# Patient Record
Sex: Female | Born: 1940 | Race: White | Hispanic: No | Marital: Married | Smoking: Never smoker
Health system: Southern US, Community
[De-identification: ages and names within clinical notes are randomized; demographics above are authoritative.]

## PROBLEM LIST (undated history)

## (undated) DIAGNOSIS — I10 Essential (primary) hypertension: Secondary | ICD-10-CM

## (undated) DIAGNOSIS — K219 Gastro-esophageal reflux disease without esophagitis: Secondary | ICD-10-CM

## (undated) DIAGNOSIS — Z86010 Personal history of colonic polyps: Principal | ICD-10-CM

## (undated) DIAGNOSIS — I2510 Atherosclerotic heart disease of native coronary artery without angina pectoris: Secondary | ICD-10-CM

## (undated) DIAGNOSIS — E785 Hyperlipidemia, unspecified: Secondary | ICD-10-CM

## (undated) DIAGNOSIS — Z9289 Personal history of other medical treatment: Secondary | ICD-10-CM

## (undated) HISTORY — DX: Hyperlipidemia, unspecified: E78.5

## (undated) HISTORY — DX: Personal history of other medical treatment: Z92.89

## (undated) HISTORY — DX: Essential (primary) hypertension: I10

## (undated) HISTORY — DX: Gastro-esophageal reflux disease without esophagitis: K21.9

## (undated) HISTORY — DX: Personal history of colonic polyps: Z86.010

## (undated) HISTORY — PX: ROTATOR CUFF REPAIR: SHX139

## (undated) HISTORY — DX: Atherosclerotic heart disease of native coronary artery without angina pectoris: I25.10

## (undated) HISTORY — PX: CATARACT EXTRACTION: SUR2

## (undated) HISTORY — PX: COLONOSCOPY: SHX174

## (undated) HISTORY — PX: TONSILLECTOMY: SUR1361

## (undated) HISTORY — PX: TUBAL LIGATION: SHX77

---

## 2007-03-11 DIAGNOSIS — Z86010 Personal history of colonic polyps: Secondary | ICD-10-CM

## 2007-03-11 DIAGNOSIS — Z860101 Personal history of adenomatous and serrated colon polyps: Secondary | ICD-10-CM | POA: Insufficient documentation

## 2007-03-11 HISTORY — DX: Personal history of adenomatous and serrated colon polyps: Z86.0101

## 2007-03-11 HISTORY — DX: Personal history of colonic polyps: Z86.010

## 2009-04-04 ENCOUNTER — Encounter: Admission: RE | Admit: 2009-04-04 | Discharge: 2009-04-04 | Payer: Self-pay | Admitting: Family Medicine

## 2009-05-02 ENCOUNTER — Encounter: Admission: RE | Admit: 2009-05-02 | Discharge: 2009-05-02 | Payer: Self-pay | Admitting: Family Medicine

## 2009-05-14 ENCOUNTER — Ambulatory Visit (HOSPITAL_COMMUNITY): Admission: RE | Admit: 2009-05-14 | Discharge: 2009-05-14 | Payer: Self-pay | Admitting: Family Medicine

## 2009-12-23 LAB — HM DIABETES EYE EXAM

## 2010-04-27 IMAGING — CR DG THORACIC SPINE 3V
3 series · 3 of 3 positions shown · non-contrast
Comparison: None

CLINICAL DATA: Back pain.

THORACIC SPINE - 2 VIEW + SWIMMERS

[t t-spine a.p.]
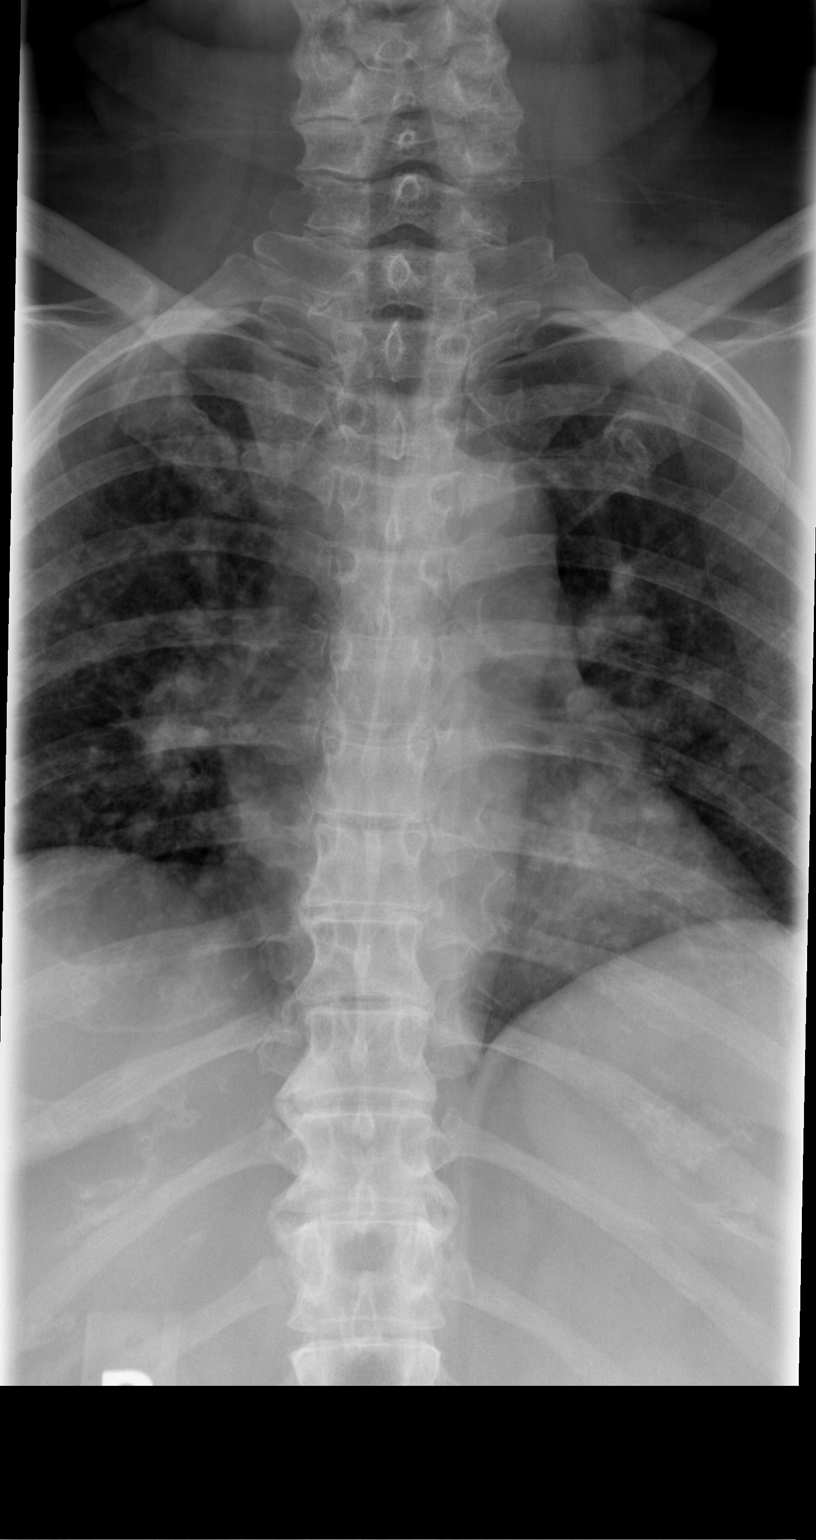

[t t-spine lat *]
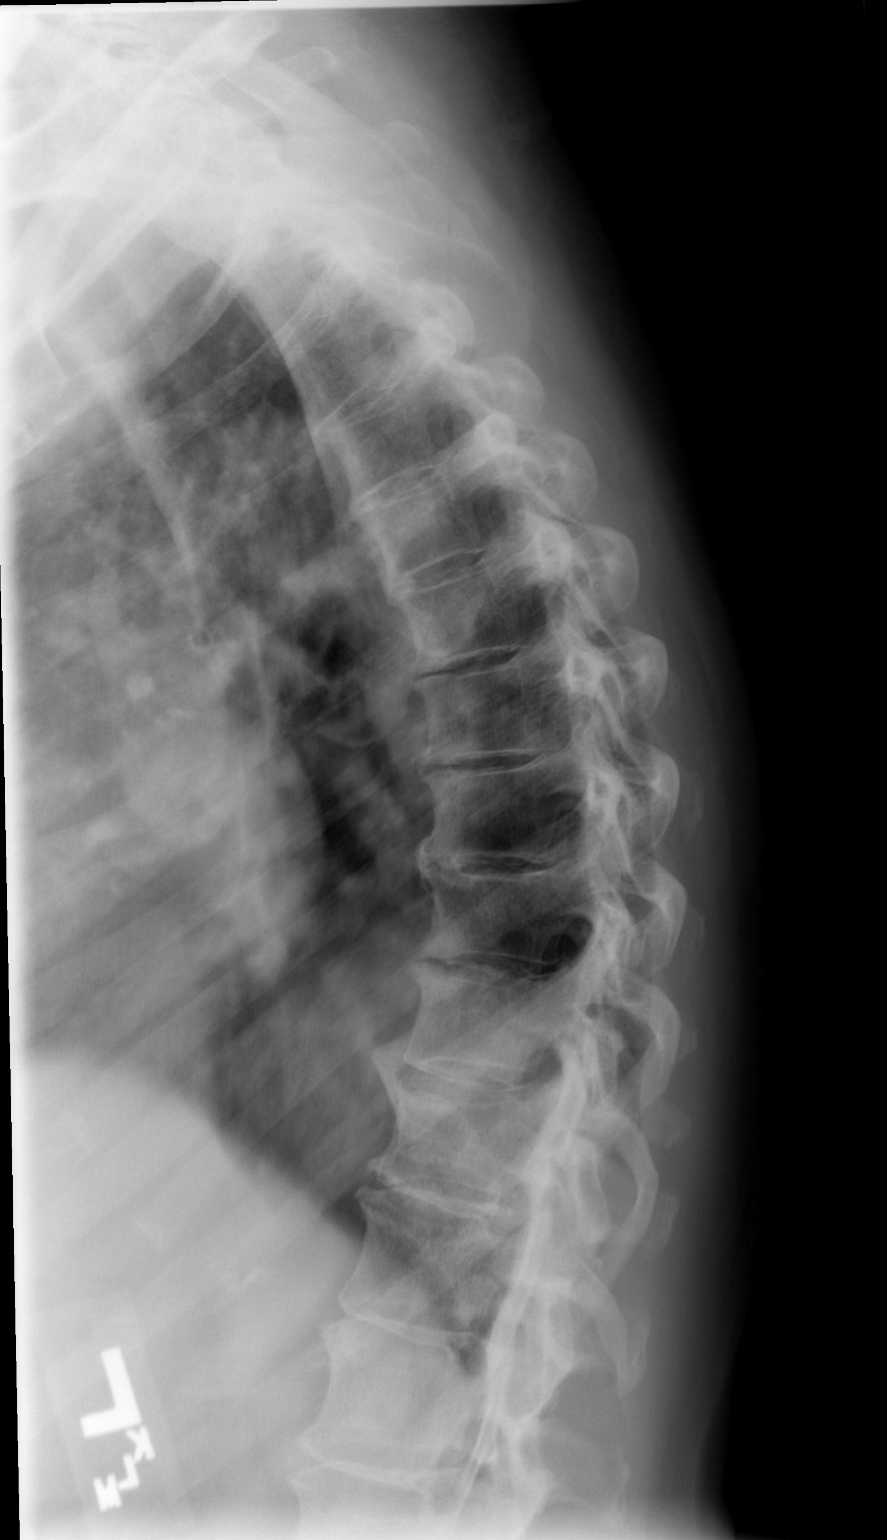

[t swimmers]
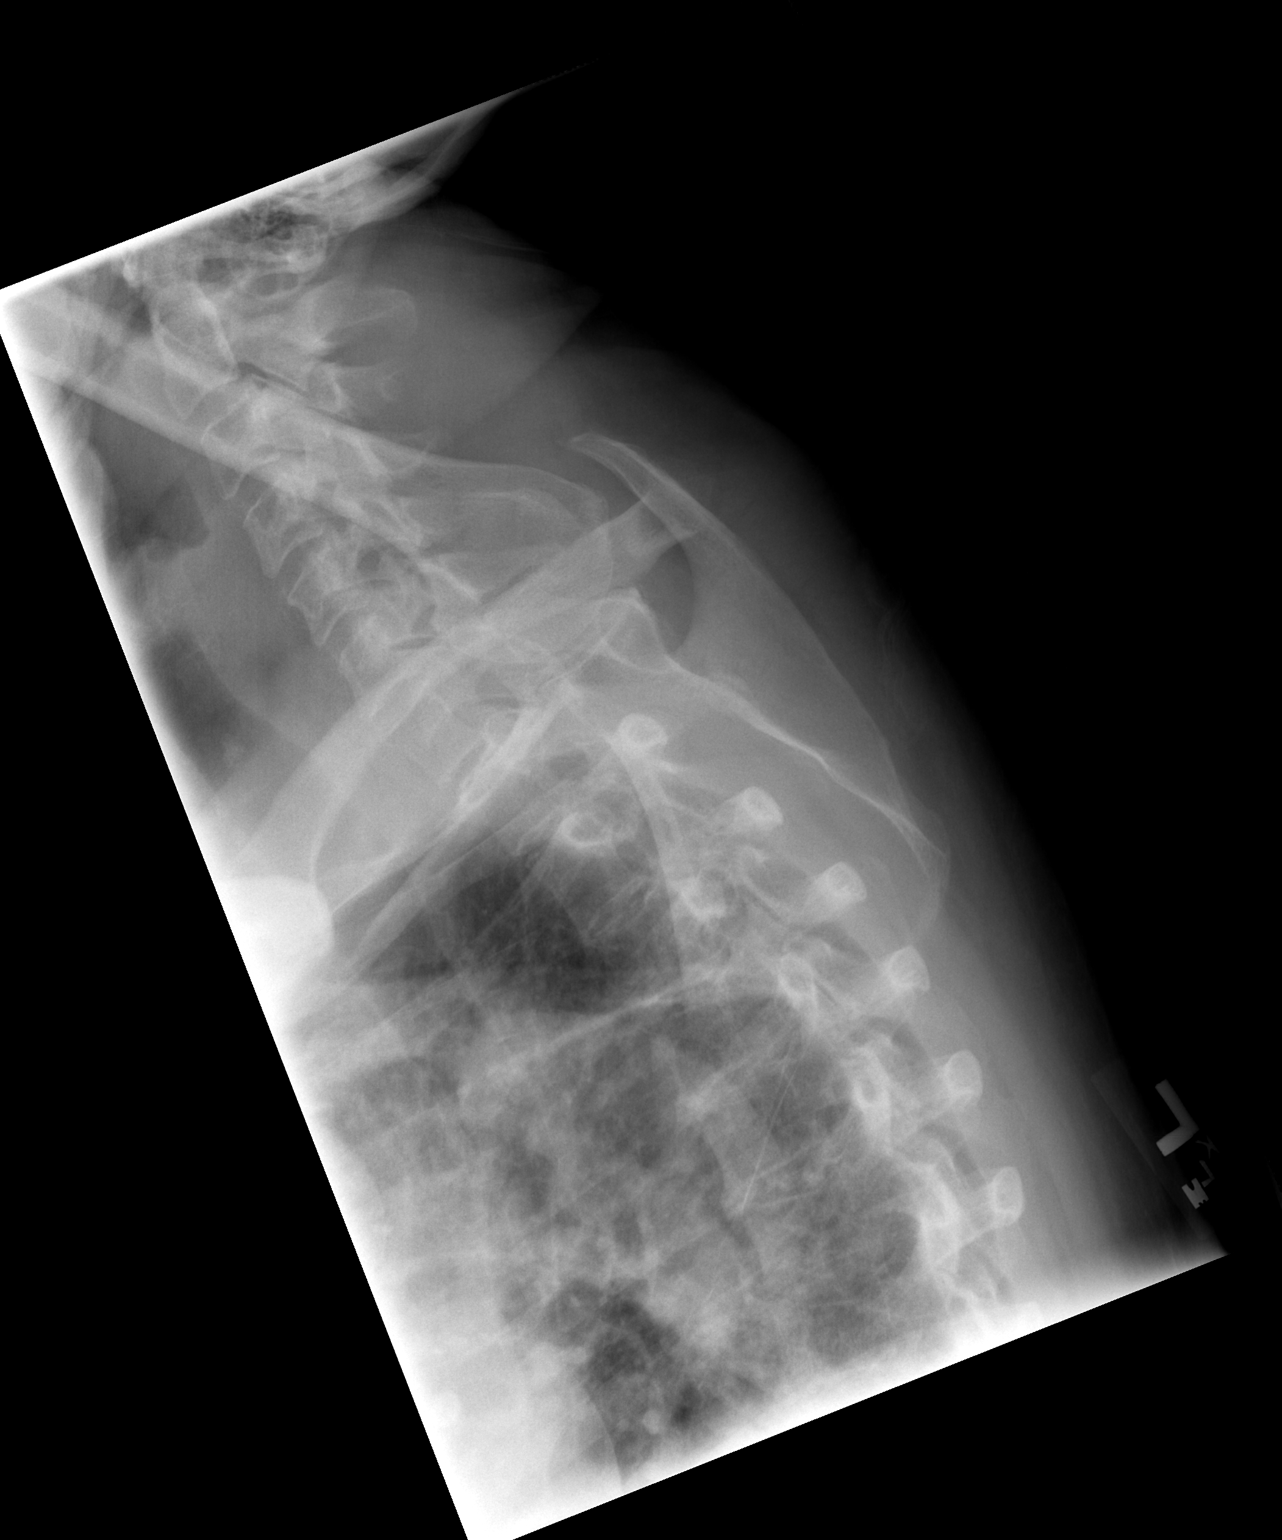

[3 of 3 positions shown; findings below may reference images not displayed]

FINDINGS: Degenerative disc disease noted throughout the mid and
lower thoracic spine.  There is normal alignment.  No fracture.
Visualized posterior ribs unremarkable.
IMPRESSION: Mild to moderate degenerative disc disease.  No acute findings.

## 2010-08-01 ENCOUNTER — Encounter: Admission: RE | Admit: 2010-08-01 | Discharge: 2010-08-01 | Payer: Self-pay | Admitting: Family Medicine

## 2010-12-29 LAB — HM DIABETES EYE EXAM

## 2011-03-23 LAB — GLUCOSE, CAPILLARY: Glucose-Capillary: 86 mg/dL (ref 70–99)

## 2011-09-10 ENCOUNTER — Other Ambulatory Visit: Payer: Self-pay | Admitting: Family Medicine

## 2011-09-22 ENCOUNTER — Ambulatory Visit
Admission: RE | Admit: 2011-09-22 | Discharge: 2011-09-22 | Disposition: A | Discharge status: Home / Self Care | Payer: Medicare Other | Source: Ambulatory Visit | Attending: Family Medicine | Admitting: Family Medicine

## 2012-02-26 LAB — HM DIABETES EYE EXAM

## 2012-04-11 LAB — ABI

## 2012-04-20 ENCOUNTER — Encounter: Payer: Self-pay | Admitting: *Deleted

## 2012-04-20 DIAGNOSIS — K219 Gastro-esophageal reflux disease without esophagitis: Secondary | ICD-10-CM | POA: Insufficient documentation

## 2012-07-05 ENCOUNTER — Other Ambulatory Visit: Payer: Self-pay | Admitting: Family Medicine

## 2012-07-05 DIAGNOSIS — Z780 Asymptomatic menopausal state: Secondary | ICD-10-CM

## 2012-07-05 DIAGNOSIS — Z1231 Encounter for screening mammogram for malignant neoplasm of breast: Secondary | ICD-10-CM

## 2012-08-08 ENCOUNTER — Ambulatory Visit
Admission: RE | Admit: 2012-08-08 | Discharge: 2012-08-08 | Disposition: A | Discharge status: Home / Self Care | Payer: Medicare Other | Source: Ambulatory Visit | Attending: Family Medicine | Admitting: Family Medicine

## 2012-08-08 DIAGNOSIS — Z1231 Encounter for screening mammogram for malignant neoplasm of breast: Secondary | ICD-10-CM

## 2012-08-08 DIAGNOSIS — Z780 Asymptomatic menopausal state: Secondary | ICD-10-CM

## 2012-08-25 ENCOUNTER — Encounter: Payer: Self-pay | Admitting: Cardiovascular Disease

## 2013-09-29 ENCOUNTER — Other Ambulatory Visit: Payer: Self-pay | Admitting: Family Medicine

## 2013-09-29 DIAGNOSIS — Z1231 Encounter for screening mammogram for malignant neoplasm of breast: Secondary | ICD-10-CM

## 2013-10-25 ENCOUNTER — Ambulatory Visit
Admission: RE | Admit: 2013-10-25 | Discharge: 2013-10-25 | Disposition: A | Discharge status: Home / Self Care | Payer: Medicare Other | Source: Ambulatory Visit | Attending: Family Medicine | Admitting: Family Medicine

## 2013-10-25 DIAGNOSIS — Z1231 Encounter for screening mammogram for malignant neoplasm of breast: Secondary | ICD-10-CM

## 2014-02-22 LAB — HM DIABETES EYE EXAM

## 2015-02-25 LAB — HM DIABETES EYE EXAM

## 2015-04-01 ENCOUNTER — Other Ambulatory Visit: Payer: Self-pay | Admitting: Family Medicine

## 2015-04-01 DIAGNOSIS — Z1231 Encounter for screening mammogram for malignant neoplasm of breast: Secondary | ICD-10-CM

## 2015-04-01 DIAGNOSIS — E2839 Other primary ovarian failure: Secondary | ICD-10-CM

## 2015-04-10 ENCOUNTER — Ambulatory Visit
Admission: RE | Admit: 2015-04-10 | Discharge: 2015-04-10 | Disposition: A | Discharge status: Home / Self Care | Payer: Medicare Other | Source: Ambulatory Visit | Attending: Family Medicine | Admitting: Family Medicine

## 2015-04-10 DIAGNOSIS — Z1231 Encounter for screening mammogram for malignant neoplasm of breast: Secondary | ICD-10-CM

## 2015-04-10 DIAGNOSIS — E2839 Other primary ovarian failure: Secondary | ICD-10-CM

## 2015-10-04 ENCOUNTER — Encounter: Payer: Self-pay | Admitting: Internal Medicine

## 2015-10-17 ENCOUNTER — Telehealth: Payer: Self-pay | Admitting: Internal Medicine

## 2015-10-17 ENCOUNTER — Ambulatory Visit (AMBULATORY_SURGERY_CENTER): Payer: Self-pay

## 2015-10-17 DIAGNOSIS — Z860100 Personal history of colon polyps, unspecified: Secondary | ICD-10-CM

## 2015-10-17 NOTE — Telephone Encounter (Signed)
Rec'd from [facility] forward 5 pages to Dr.Gessner

## 2015-10-17 NOTE — Progress Notes (Signed)
No allergies to eggs or soy No diet/weight loss meds No home oxygen No past problems with anesthesia  Has email; refused emmi 

## 2015-10-22 ENCOUNTER — Encounter: Payer: Self-pay | Admitting: Internal Medicine

## 2015-10-31 ENCOUNTER — Encounter: Payer: Self-pay | Admitting: Internal Medicine

## 2015-10-31 ENCOUNTER — Ambulatory Visit (AMBULATORY_SURGERY_CENTER): Payer: Medicare Other | Admitting: Internal Medicine

## 2015-10-31 VITALS — BP 106/58 | HR 65 | Temp 96.2°F | Resp 27 | Ht 60.0 in | Wt 133.4 lb

## 2015-10-31 DIAGNOSIS — Z86010 Personal history of colonic polyps: Secondary | ICD-10-CM | POA: Diagnosis not present

## 2015-10-31 MED ORDER — SODIUM CHLORIDE 0.9 % IV SOLN: 500.0000 mL | INTRAVENOUS | Status: DC

## 2015-10-31 NOTE — Patient Instructions (Addendum)
   No polyps today!  I do not recommend further routine repeat colonoscopy for you.  I appreciate the opportunity to care for you. Iva Booparl E. Delio Slates, MD, FACG  YOU HAD AN ENDOSCOPIC PROCEDURE TODAY AT THE [facility]:   Refer to the procedure report that was given to you for any specific questions about what was found during the examination.  If the procedure report does not answer your questions, please call your gastroenterologist to clarify.  If you requested that your care partner not be given the details of your procedure findings, then the procedure report has been included in a sealed envelope for you to review at your convenience later.  YOU SHOULD EXPECT: Some feelings of bloating in the abdomen. Passage of more gas than usual.  Walking can help get rid of the air that was put into your GI tract during the procedure and reduce the bloating. If you had a lower endoscopy (such as a colonoscopy or flexible sigmoidoscopy) you may notice spotting of blood in your stool or on the toilet paper. If you underwent a bowel prep for your procedure, you may not have a normal bowel movement for a few days.  Please Note:  You might notice some irritation and congestion in your nose or some drainage.  This is from the oxygen used during your procedure.  There is no need for concern and it should clear up in a day or so.  SYMPTOMS TO REPORT IMMEDIATELY:   Following lower endoscopy (colonoscopy or flexible sigmoidoscopy):  Excessive amounts of blood in the stool  Significant tenderness or worsening of abdominal pains  Swelling of the abdomen that is new, acute  Fever of 100F or higher  For urgent or emergent issues, a gastroenterologist can be reached at any hour by calling [phone].   DIET: Your first meal following the procedure should be a small meal and then it is ok to progress to your normal diet. Heavy or fried foods are harder to digest and may make you feel nauseous  or bloated.  Likewise, meals heavy in dairy and vegetables can increase bloating.  Drink plenty of fluids but you should avoid alcoholic beverages for 24 hours.  ACTIVITY:  You should plan to take it easy for the rest of today and you should NOT DRIVE or use heavy machinery until tomorrow (because of the sedation medicines used during the test).    FOLLOW UP: Our staff will call the number listed on your records the next business day following your procedure to check on you and address any questions or concerns that you may have regarding the information given to you following your procedure. If we do not reach you, we will leave a message.  However, if you are feeling well and you are not experiencing any problems, there is no need to return our call.  We will assume that you have returned to your regular daily activities without incident.  SIGNATURES/CONFIDENTIALITY: You and/or your care partner have signed paperwork which will be entered into your electronic medical record.  These signatures attest to the fact that that the information above on your After Visit Summary has been reviewed and is understood.  Full responsibility of the confidentiality of this discharge information lies with you and/or your care-partner.  Continue your normal medications  No further need for follow up unless you have any GI problems

## 2015-10-31 NOTE — Progress Notes (Signed)
Patient awakening,vss,report to rn 

## 2015-10-31 NOTE — Op Note (Addendum)
[facility] [address] [place], [id number]   COLONOSCOPY PROCEDURE REPORT  PATIENT: Jade Barber, Jade Barber  MR#: [id number] BIRTHDATE: [birth year] , 74  yrs. old GENDER: female ENDOSCOPIST: Iva Booparl E Tauni Sanks, MD, [facility] PROCEDURE DATE:  10/31/2015 PROCEDURE:   Colonoscopy, surveillance First Screening Colonoscopy - Avg.  risk and is 50 yrs.  old or older - No.  Prior Negative Screening - Now for repeat screening. N/A  History of Adenoma - Now for follow-up colonoscopy & has been > or = to 3 yrs.  Yes hx of adenoma.  Has been 3 or more years since last colonoscopy.  Polyps removed today? No Recommend repeat exam, <10 yrs? No ASA CLASS:   Class III INDICATIONS:Surveillance due to prior colonic neoplasia and PH Colon Adenoma. MEDICATIONS: Propofol 200 mg IV and Monitored anesthesia care  DESCRIPTION OF PROCEDURE:   After the risks benefits and alternatives of the procedure were thoroughly explained, informed consent was obtained.  The digital rectal exam revealed no abnormalities of the rectum.   The LB [id number]-H190 [id number]  endoscope was introduced through the anus and advanced to the cecum, which was identified by both the appendix and ileocecal valve. No adverse events experienced.   The quality of the prep was adequate (MiraLax was used)  The instrument was then slowly withdrawn as the colon was fully examined. Estimated blood loss is zero unless otherwise noted in this procedure report.      COLON FINDINGS: A normal appearing cecum, ileocecal valve, and appendiceal orifice were identified.  The ascending, transverse, descending, sigmoid colon, and rectum appeared unremarkable. Retroflexed views revealed no abnormalities. The time to cecum = 5.2 Withdrawal time = 13.4   The scope was withdrawn and the procedure completed. COMPLICATIONS: There were no immediate complications.  ENDOSCOPIC IMPRESSION: Normal colonoscopy  RECOMMENDATIONS: Routine repeat colonoscopy  screening not necessary.  See me/GI as needed.  eSigned:  Iva Booparl E Aubreigh Fuerte, MD, [facility] 10/31/2015 9:26 AM Revised: 10/31/2015 9:26 AM  cc: The Patient         Dr. Asencion Partridgeamille Andy

## 2015-11-01 ENCOUNTER — Telehealth: Payer: Self-pay | Admitting: *Deleted

## 2015-11-01 NOTE — Telephone Encounter (Signed)
  Follow up Call-  Call back number 10/31/2015  Post procedure Call Back phone  # [phone]  Permission to leave phone message Yes     Patient questions:  Do you have a fever, pain , or abdominal swelling? No. Pain Score  0 *  Have you tolerated food without any problems? Yes.    Have you been able to return to your normal activities? Yes.    Do you have any questions about your discharge instructions: Diet   No. Medications  No. Follow up visit  No.  Do you have questions or concerns about your Care? No.  Actions: * If pain score is 4 or above: No action needed, pain <4.

## 2015-11-12 ENCOUNTER — Encounter: Payer: Self-pay | Admitting: Internal Medicine

## 2016-02-26 DIAGNOSIS — H52223 Regular astigmatism, bilateral: Secondary | ICD-10-CM | POA: Diagnosis not present

## 2016-02-26 LAB — HM DIABETES EYE EXAM

## 2016-03-13 DIAGNOSIS — J069 Acute upper respiratory infection, unspecified: Secondary | ICD-10-CM | POA: Diagnosis not present

## 2016-03-13 DIAGNOSIS — B9789 Other viral agents as the cause of diseases classified elsewhere: Secondary | ICD-10-CM | POA: Diagnosis not present

## 2016-04-03 DIAGNOSIS — E782 Mixed hyperlipidemia: Secondary | ICD-10-CM | POA: Diagnosis not present

## 2016-04-03 DIAGNOSIS — R05 Cough: Secondary | ICD-10-CM | POA: Diagnosis not present

## 2016-04-03 DIAGNOSIS — H8110 Benign paroxysmal vertigo, unspecified ear: Secondary | ICD-10-CM | POA: Diagnosis not present

## 2016-04-03 DIAGNOSIS — E11319 Type 2 diabetes mellitus with unspecified diabetic retinopathy without macular edema: Secondary | ICD-10-CM | POA: Diagnosis not present

## 2016-04-03 DIAGNOSIS — E1169 Type 2 diabetes mellitus with other specified complication: Secondary | ICD-10-CM | POA: Diagnosis not present

## 2016-04-03 DIAGNOSIS — I10 Essential (primary) hypertension: Secondary | ICD-10-CM | POA: Diagnosis not present

## 2016-05-13 DIAGNOSIS — E119 Type 2 diabetes mellitus without complications: Secondary | ICD-10-CM | POA: Diagnosis not present

## 2016-07-03 ENCOUNTER — Other Ambulatory Visit: Payer: Self-pay | Admitting: Family Medicine

## 2016-07-03 DIAGNOSIS — E1139 Type 2 diabetes mellitus with other diabetic ophthalmic complication: Secondary | ICD-10-CM | POA: Diagnosis not present

## 2016-07-03 DIAGNOSIS — Z1231 Encounter for screening mammogram for malignant neoplasm of breast: Secondary | ICD-10-CM

## 2016-07-03 DIAGNOSIS — I10 Essential (primary) hypertension: Secondary | ICD-10-CM | POA: Diagnosis not present

## 2016-07-03 DIAGNOSIS — I2510 Atherosclerotic heart disease of native coronary artery without angina pectoris: Secondary | ICD-10-CM | POA: Diagnosis not present

## 2016-07-03 DIAGNOSIS — I2584 Coronary atherosclerosis due to calcified coronary lesion: Secondary | ICD-10-CM | POA: Diagnosis not present

## 2016-07-17 ENCOUNTER — Ambulatory Visit
Admission: RE | Admit: 2016-07-17 | Discharge: 2016-07-17 | Disposition: A | Discharge status: Home / Self Care | Payer: Medicare Other | Source: Ambulatory Visit | Attending: Family Medicine | Admitting: Family Medicine

## 2016-07-17 DIAGNOSIS — Z1231 Encounter for screening mammogram for malignant neoplasm of breast: Secondary | ICD-10-CM

## 2016-09-14 DIAGNOSIS — Z23 Encounter for immunization: Secondary | ICD-10-CM | POA: Diagnosis not present

## 2016-09-24 DIAGNOSIS — J40 Bronchitis, not specified as acute or chronic: Secondary | ICD-10-CM | POA: Diagnosis not present

## 2016-09-24 DIAGNOSIS — I2510 Atherosclerotic heart disease of native coronary artery without angina pectoris: Secondary | ICD-10-CM | POA: Diagnosis not present

## 2016-09-24 DIAGNOSIS — I2584 Coronary atherosclerosis due to calcified coronary lesion: Secondary | ICD-10-CM | POA: Diagnosis not present

## 2016-09-24 DIAGNOSIS — I10 Essential (primary) hypertension: Secondary | ICD-10-CM | POA: Diagnosis not present

## 2016-10-01 DIAGNOSIS — Z0000 Encounter for general adult medical examination without abnormal findings: Secondary | ICD-10-CM | POA: Diagnosis not present

## 2016-12-04 DIAGNOSIS — E119 Type 2 diabetes mellitus without complications: Secondary | ICD-10-CM | POA: Diagnosis not present

## 2017-03-03 DIAGNOSIS — H1859 Other hereditary corneal dystrophies: Secondary | ICD-10-CM | POA: Diagnosis not present

## 2017-03-03 DIAGNOSIS — H43813 Vitreous degeneration, bilateral: Secondary | ICD-10-CM | POA: Diagnosis not present

## 2017-03-03 DIAGNOSIS — E113291 Type 2 diabetes mellitus with mild nonproliferative diabetic retinopathy without macular edema, right eye: Secondary | ICD-10-CM | POA: Diagnosis not present

## 2017-03-03 DIAGNOSIS — H26493 Other secondary cataract, bilateral: Secondary | ICD-10-CM | POA: Diagnosis not present

## 2017-03-04 DIAGNOSIS — E119 Type 2 diabetes mellitus without complications: Secondary | ICD-10-CM | POA: Diagnosis not present

## 2017-04-16 DIAGNOSIS — E1169 Type 2 diabetes mellitus with other specified complication: Secondary | ICD-10-CM | POA: Diagnosis not present

## 2017-04-16 DIAGNOSIS — I10 Essential (primary) hypertension: Secondary | ICD-10-CM | POA: Diagnosis not present

## 2017-04-16 DIAGNOSIS — E113299 Type 2 diabetes mellitus with mild nonproliferative diabetic retinopathy without macular edema, unspecified eye: Secondary | ICD-10-CM | POA: Diagnosis not present

## 2017-04-16 DIAGNOSIS — E782 Mixed hyperlipidemia: Secondary | ICD-10-CM | POA: Diagnosis not present

## 2017-04-20 DIAGNOSIS — E875 Hyperkalemia: Secondary | ICD-10-CM | POA: Diagnosis not present

## 2017-04-20 DIAGNOSIS — N189 Chronic kidney disease, unspecified: Secondary | ICD-10-CM | POA: Diagnosis not present

## 2017-11-10 ENCOUNTER — Ambulatory Visit: Payer: Medicare Other | Admitting: Family Medicine

## 2017-11-10 ENCOUNTER — Encounter: Payer: Self-pay | Admitting: Family Medicine

## 2017-11-10 VITALS — BP 120/62 | HR 74 | Temp 98.3°F | Ht 60.0 in | Wt 132.9 lb

## 2017-11-10 DIAGNOSIS — G8929 Other chronic pain: Secondary | ICD-10-CM

## 2017-11-10 DIAGNOSIS — I10 Essential (primary) hypertension: Secondary | ICD-10-CM

## 2017-11-10 DIAGNOSIS — R05 Cough: Secondary | ICD-10-CM

## 2017-11-10 DIAGNOSIS — M25511 Pain in right shoulder: Secondary | ICD-10-CM | POA: Diagnosis not present

## 2017-11-10 DIAGNOSIS — M25512 Pain in left shoulder: Secondary | ICD-10-CM | POA: Diagnosis not present

## 2017-11-10 DIAGNOSIS — E119 Type 2 diabetes mellitus without complications: Secondary | ICD-10-CM

## 2017-11-10 DIAGNOSIS — R0982 Postnasal drip: Secondary | ICD-10-CM

## 2017-11-10 DIAGNOSIS — Z23 Encounter for immunization: Secondary | ICD-10-CM | POA: Diagnosis not present

## 2017-11-10 DIAGNOSIS — R059 Cough, unspecified: Secondary | ICD-10-CM

## 2017-11-10 MED ORDER — AMLODIPINE BESYLATE 2.5 MG PO TABS: 2.5000 mg | ORAL_TABLET | Freq: Every day | ORAL | 3 refills | Status: DC

## 2017-11-10 MED ORDER — LORATADINE 10 MG PO TABS: 10.0000 mg | ORAL_TABLET | Freq: Every day | ORAL | 11 refills | Status: AC

## 2017-11-10 MED ORDER — LOSARTAN POTASSIUM 100 MG PO TABS: 100.0000 mg | ORAL_TABLET | Freq: Every day | ORAL | 3 refills | Status: DC

## 2017-11-10 NOTE — Patient Instructions (Addendum)
It is ok to stop taking your Amlodipine (Norvasc) 2.5 mg daily for blood pressure.  Please check your blood pressure at home to see how you do with this change.  If needed we can restart the Amlodipine if your blood pressure becomes elevated.  I have sent in a prescription for Claritin to see if it helps with your allergies and cough.   Cough, Adult Coughing is a reflex that clears your throat and your airways. Coughing helps to heal and protect your lungs. It is normal to cough occasionally, but a cough that happens with other symptoms or lasts a long time may be a sign of a condition that needs treatment. A cough may last only 2-3 weeks (acute), or it may last longer than 8 weeks (chronic). What are the causes? Coughing is commonly caused by:  Breathing in substances that irritate your lungs.  A viral or bacterial respiratory infection.  Allergies.  Asthma.  Postnasal drip.  Smoking.  Acid backing up from the stomach into the esophagus (gastroesophageal reflux).  Certain medicines.  Chronic lung problems, including COPD (or rarely, lung cancer).  Other medical conditions such as heart failure.  Follow these instructions at home: Pay attention to any changes in your symptoms. Take these actions to help with your discomfort:  Take medicines only as told by your health care provider. ? If you were prescribed an antibiotic medicine, take it as told by your health care provider. Do not stop taking the antibiotic even if you start to feel better. ? Talk with your health care provider before you take a cough suppressant medicine.  Drink enough fluid to keep your urine clear or pale yellow.  If the air is dry, use a cold steam vaporizer or humidifier in your bedroom or your home to help loosen secretions.  Avoid anything that causes you to cough at work or at home.  If your cough is worse at night, try sleeping in a semi-upright position.  Avoid cigarette smoke. If you smoke,  quit smoking. If you need help quitting, ask your health care provider.  Avoid caffeine.  Avoid alcohol.  Rest as needed.  Contact a health care provider if:  You have new symptoms.  You cough up pus.  Your cough does not get better after 2-3 weeks, or your cough gets worse.  You cannot control your cough with suppressant medicines and you are losing sleep.  You develop pain that is getting worse or pain that is not controlled with pain medicines.  You have a fever.  You have unexplained weight loss.  You have night sweats. Get help right away if:  You cough up blood.  You have difficulty breathing.  Your heartbeat is very fast. This information is not intended to replace advice given to you by your health care provider. Make sure you discuss any questions you have with your health care provider. Document Released: 05/29/2011 Document Revised: 05/07/2016 Document Reviewed: 02/06/2015 Elsevier Interactive Patient Education  2017 Elsevier Inc.  Allergies, Adult An allergy is when your body's defense system (immune system) overreacts to an otherwise harmless substance (allergen) that you breathe in or eat or something that touches your skin. When you come into contact with something that you are allergic to, your immune system produces certain proteins (antibodies). These proteins cause cells to release chemicals (histamines) that trigger the symptoms of an allergic reaction. Allergies often affect the nasal passages (allergic rhinitis), eyes (allergic conjunctivitis), skin (atopic dermatitis), and stomach. Allergies can be  mild or severe. Allergies cannot spread from person to person (are not contagious). They can develop at any age and may be outgrown. What increases the risk? You may be at greater risk of allergies if other people in your family have allergies. What are the signs or symptoms? Symptoms depend on what type of allergy you have. They may include:  Runny,  stuffy nose.  Sneezing.  Itchy mouth, ears, or throat.  Postnasal drip.  Sore throat.  Itchy, red, watery, or puffy eyes.  Skin rash or hives.  Stomach pain.  Vomiting.  Diarrhea.  Bloating.  Wheezing or coughing.  People with a severe allergy to food, medicine, or an insect bite may have a life-threatening allergic reaction (anaphylaxis). Symptoms of anaphylaxis include:  Hives.  Itching.  Flushed face.  Swollen lips, tongue, or mouth.  Tight or swollen throat.  Chest pain or tightness in the chest.  Trouble breathing or shortness of breath.  Rapid heartbeat.  Dizziness or fainting.  Vomiting.  Diarrhea.  Pain in the abdomen.  How is this diagnosed? This condition is diagnosed based on:  Your symptoms.  Your family and medical history.  A physical exam.  You may need to see a health care provider who specializes in treating allergies (allergist). You may also have tests, including:  Skin tests to see which allergens are causing your symptoms, such as: ? Skin prick test. In this test, your skin is pricked with a tiny needle and exposed to small amounts of possible allergens to see if your skin reacts. ? Intradermal skin test. In this test, a small amount of allergen is injected under your skin to see if your skin reacts. ? Patch test. In this test, a small amount of allergen is placed on your skin and then your skin is covered with a bandage. Your health care provider will check your skin after a couple of days to see if a rash has developed.  Blood tests.  Challenges tests. In this test, you inhale a small amount of allergen by mouth to see if you have an allergic reaction.  You may also be asked to:  Keep a food diary. A food diary is a record of all the foods and drinks you have in a day and any symptoms you experience.  Practice an elimination diet. An elimination diet involves eliminating specific foods from your diet and then adding them  back in one by one to find out if a certain food causes an allergic reaction.  How is this treated? Treatment for allergies depends on your symptoms. Treatment may include:  Cold compresses to soothe itching and swelling.  Eye drops.  Nasal sprays.  Using a saline spray or container (neti pot) to flush out the nose (nasal irrigation). These methods can help clear away mucus and keep the nasal passages moist.  Using a humidifier.  Oral antihistamines or other medicines to block allergic reaction and inflammation.  Skin creams to treat rashes or itching.  Diet changes to eliminate food allergy triggers.  Repeated exposure to tiny amounts of allergens to build up a tolerance and prevent future allergic reactions (immunotherapy). These include: ? Allergy shots. ? Oral treatment. This involves taking small doses of an allergen under the tongue (sublingual immunotherapy).  Emergency epinephrine injection (auto-injector) in case of an allergic emergency. This is a self-injectable, pre-measured medicine that must be given within the first few minutes of a serious allergic reaction.  Follow these instructions at home:  Avoid known  allergens whenever possible.  If you suffer from airborne allergens, wash out your nose daily. You can do this with a saline spray or a neti pot to flush out your nose (nasal irrigation).  Take over-the-counter and prescription medicines only as told by your health care provider.  Keep all follow-up visits as told by your health care provider. This is important.  If you are at risk of a severe allergic reaction (anaphylaxis), keep your auto-injector with you at all times.  If you have ever had anaphylaxis, wear a medical alert bracelet or necklace that states you have a severe allergy. Contact a health care provider if:  Your symptoms do not improve with treatment. Get help right away if:  You have symptoms of anaphylaxis, such as: ? Swollen mouth,  tongue, or throat. ? Pain or tightness in your chest. ? Trouble breathing or shortness of breath. ? Dizziness or fainting. ? Severe abdominal pain, vomiting, or diarrhea. This information is not intended to replace advice given to you by your health care provider. Make sure you discuss any questions you have with your health care provider. Document Released: 02/23/2003 Document Revised: 07/30/2016 Document Reviewed: 06/17/2016 Elsevier Interactive Patient Education  [employer]2018 Elsevier Inc.

## 2017-11-10 NOTE — Progress Notes (Signed)
Patient presents to clinic today to establish care.  SUBJECTIVE: PMH: Pt is a 76 yo female with pmh sig for GERD, HTN, and DM II.  [facility] [address].  GERD: -taking omeprazole 20 mg -Does not eat spicy foods -Does not note any association with specific foods  HTN: -Patient does not recall reason for being on blood pressure medicine -Currently taking Norvasc 2.5 mg and losartan 100 mg daily -Has BP cuff at home but not currently checking  DM type II: -Pt taking metformin 1000 mg BID -Pt denies GI issues -Pt checks FSBS every now and again.  Typically 140s rarely 80 in a.m. -Last hemoglobin A1c 6 months ago  Cough: -Has been ongoing for years -Pt denies allergy history -Was on lisinopril in the past, but it was d/c'd  Chronic pain bilateral shoulders: -Patient mentions this at end of visit while walking out. -Wonders if has arthritis. -Never had any imaging done -Pain is off and on -Has Voltaren gel as needed  Allergies: Charlesetta GaribaldiFormaldehyde-rash Ampicillin/amoxicillin-rash  Past surgical history: Cataract surgery C-section x2 Tonsillectomy Bilateral tubal ligation  Social history: Patient is married.  Her husband is currently battling pancreatic cancer and is on hospice.  Patient is his caregiver.  She has an aide that comes in once a week to help with bathing her husband.  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  patient has 2 children.  Patient denies tobacco use, alcohol use, drug use.  Family medical history: Mom-deceased, cancer Dad-deceased, asthma, COPD Sister-Kathy, alive,?  Arthritis Daughter Eunice BlaseDebbie, alive Daughter-Amy, alive MGM-deceased, MI MGF-deceased, DM PGM-deceased PGF-deceased  Health Maintenance: Dental --Dr. Irene LimboGoodrich Vision --Dr. Burgess Estelleanner.   Last vision check February 2018 Immunizations --influenza vaccine given today, Pneumovax in 2009, last TB skin test 2000, last  shingles vaccine 2009.  In the past pt had a positive TB skin test 2/2 vaccination. Colonoscopy --2017 Mammogram --2017 PAP --? Bone Density --years ago   Past Medical History:  Diagnosis Date  . Coronary artery calcification seen on CAT scan   . Diabetes mellitus   . GERD (gastroesophageal reflux disease)   . History of blood transfusion    s/p transfusion  . Hx of adenomatous polyp of colon 03/11/2007  . Hyperlipidemia   . Hypertension     Past Surgical History:  Procedure Laterality Date  . CATARACT EXTRACTION    . CESAREAN SECTION    . COLONOSCOPY    . ROTATOR CUFF REPAIR    . TONSILLECTOMY    . TUBAL LIGATION      Current Outpatient Medications on File Prior to Visit  Medication Sig Dispense Refill  . amLODipine (NORVASC) 10 MG tablet Take 10 mg by mouth daily. Takes 1/2 tablet for 5 mg total    . aspirin EC 81 MG tablet Take 81 mg by mouth daily.    Marland Kitchen. conjugated estrogens (PREMARIN) vaginal cream APPLY A PEA SIZED AMOUNT VAGINALLY ONCE OR TWICE A WEEK    . diclofenac sodium (VOLTAREN) 1 % GEL Place 2 g onto the skin.    Marland Kitchen. glucose blood (ONE TOUCH ULTRA TEST) test strip USE ONE STRIP TO CHECK GLUCOSE THREE TIMES DAILY    . Insulin Syringe-Needle U-100 (B-D INS SYR ULTRAFINE 1CC/31G) 31G X 5/16" 1 ML MISC USE AS DIRECTED    . losartan (COZAAR) 100 MG tablet TAKE ONE TABLET BY MOUTH DAILY    . metFORMIN (GLUCOPHAGE) 1000 MG tablet Take  1,000 mg by mouth 2 (two) times daily with a meal.    . Multiple Vitamin (MULTIVITAMIN) tablet Take 1 tablet by mouth.    . omega-3 fish oil (MAXEPA) 1000 MG CAPS capsule Take 2 g by mouth.    Marland Kitchen. omeprazole (PRILOSEC OTC) 20 MG tablet Take 20 mg by mouth.    Letta Pate. ONETOUCH DELICA LANCETS 33G MISC USE ONE LANCET TO TEST BLOOD GLUCOSE THREE TIMES A DAY    . simvastatin (ZOCOR) 40 MG tablet Take 40 mg by mouth daily.     No current facility-administered medications on file prior to visit.     Allergies  Allergen Reactions  . Crestor  [Rosuvastatin Calcium]     Myalgias   . Enalapril   . Lipitor [Atorvastatin]     myaglgias  . Amoxicillin Rash  . Formaldehyde Rash    Family History  Problem Relation Age of Onset  . Colon cancer Mother   . Colon cancer Father     Social History   Socioeconomic History  . Marital status: Married    Spouse name: Not on file  . Number of children: Not on file  . Years of education: Not on file  . Highest education level: Not on file  Social Needs  . Financial resource strain: Not on file  . Food insecurity - worry: Not on file  . Food insecurity - inability: Not on file  . Transportation needs - medical: Not on file  . Transportation needs - non-medical: Not on file  Occupational History  . Not on file  Tobacco Use  . Smoking status: Never Smoker  . Smokeless tobacco: Never Used  Substance and Sexual Activity  . Alcohol use: No  . Drug use: No  . Sexual activity: Not on file  Other Topics Concern  . Not on file  Social History Narrative  . Not on file    ROS General: Denies fever, chills, night sweats, changes in weight, changes in appetite HEENT: Denies headaches, ear pain, changes in vision, sore throat    +sneezing, occasional rhinorrhea CV: Denies CP, palpitations, SOB, orthopnea Pulm: Denies SOB, wheezing    +cough GI: Denies abdominal pain, nausea, vomiting, diarrhea, constipation GU: Denies dysuria, hematuria, frequency, vaginal discharge Msk: Denies muscle cramps, joint pains  +shoulder pain b/l Neuro: Denies weakness, numbness, tingling Skin: Denies rashes, bruising Psych: Denies depression, anxiety, hallucinations  BP 120/62 (BP Location: Left Arm, Patient Position: Sitting, Cuff Size: Normal)   Pulse 74   Temp 98.3 F (36.8 C) (Oral)   Ht 5' (1.524 m)   Wt 132 lb 14.4 oz (60.3 kg)   BMI 25.96 kg/m   Physical Exam Gen. Pleasant, well developed, well-nourished, in NAD HEENT - [place]/AT, PERRL, no scleral icterus, no nasal drainage, pharynx with  post nasal drainage and mild erythema, no exudate. Neck: No JVD, no thyromegaly Lungs: no use of accessory muscles, CTAB, no wheezes, rales or rhonchi Cardiovascular: RRR, No r/g/m, no peripheral edema Abdomen: BS present, soft, nontender,nondistended Musculoskeletal: No deformities, moves all four extremities, no cyanosis or clubbing, normal tone Neuro:  A&Ox3, CN II-XII intact, normal gait Skin:  Warm, dry, intact, no lesions Psych: normal affect, mood appropriate   No results found for this or any previous visit (from the past 2160 hour(s)).  Assessment/Plan: Cough -Discussed possibly 2/2 postnasal drainage and allergies -Will start Claritin at night to see if cough and sneezing improves  Post-nasal drainage -Will start Claritin  Controlled type 2 diabetes mellitus without complication,  without long-term current use of insulin (HCC) -Continue metformin 1000 mg twice daily -Patient encouraged to check fingerstick blood sugar daily. -Lifestyle modifications encouraged -We will check hemoglobin A1c next OFV  Essential hypertension  -Patient wishes to stop taking Norvasc.  Okay with this plan is only taking 2.5 mg. -Patient to stop taking Norvasc 2.5 mg daily.  Patient to check BP at home.  If BP is elevated will need to restart Norvasc 2.5 mg. -Continue losartan 100 mg daily -Given refills on both meds - Plan: losartan (COZAAR) 100 MG tablet, amLODipine (NORVASC) 2.5 MG tablet  Need for immunization against influenza  - Plan: Flu vaccine HIGH DOSE PF  Chronic pain of both shoulders -We will need to properly evaluate.  Patient to schedule follow-up appointment for this. -In the meantime will obtain x-rays -Patient has Voltaren gel as needed - Plan: DG Shoulder Left, DG Shoulder Right   Follow-up in the next few months for CPE and labs.  Patient can follow-up sooner for bilateral shoulder and hip pain.

## 2018-02-10 ENCOUNTER — Encounter: Payer: Self-pay | Admitting: Family Medicine

## 2018-02-10 ENCOUNTER — Ambulatory Visit: Payer: Medicare Other | Admitting: Family Medicine

## 2018-02-10 VITALS — BP 128/60 | HR 55 | Temp 97.9°F | Wt 129.0 lb

## 2018-02-10 DIAGNOSIS — I10 Essential (primary) hypertension: Secondary | ICD-10-CM

## 2018-02-10 DIAGNOSIS — Z0000 Encounter for general adult medical examination without abnormal findings: Secondary | ICD-10-CM | POA: Diagnosis not present

## 2018-02-10 DIAGNOSIS — E119 Type 2 diabetes mellitus without complications: Secondary | ICD-10-CM

## 2018-02-10 DIAGNOSIS — Z13220 Encounter for screening for lipoid disorders: Secondary | ICD-10-CM | POA: Diagnosis not present

## 2018-02-10 LAB — LIPID PANEL
CHOL/HDL RATIO: 6
Cholesterol: 245 mg/dL — ABNORMAL HIGH (ref 0–200)
HDL: 39.3 mg/dL (ref >39.00)
LDL Cholesterol: 173 mg/dL — ABNORMAL HIGH (ref 0–99)
NONHDL: 205.48
Triglycerides: 162 mg/dL — ABNORMAL HIGH (ref 0.0–149.0)
VLDL: 32.4 mg/dL (ref 0.0–40.0)

## 2018-02-10 LAB — BASIC METABOLIC PANEL
BUN: 18 mg/dL (ref 6–23)
CALCIUM: 10 mg/dL (ref 8.4–10.5)
CO2: 31 meq/L (ref 19–32)
Chloride: 105 mEq/L (ref 96–112)
Creatinine, Ser: 0.87 mg/dL (ref 0.40–1.20)
GFR: 67.21 mL/min (ref >60.00)
Glucose, Bld: 92 mg/dL (ref 70–99)
POTASSIUM: 4.1 meq/L (ref 3.5–5.1)
SODIUM: 142 meq/L (ref 135–145)

## 2018-02-10 LAB — CBC
HEMATOCRIT: 40.5 % (ref 36.0–46.0)
Hemoglobin: 13.9 g/dL (ref 12.0–15.0)
MCHC: 34.3 g/dL (ref 30.0–36.0)
MCV: 89.7 fl (ref 78.0–100.0)
Platelets: 220 10*3/uL (ref 150.0–400.0)
RBC: 4.52 Mil/uL (ref 3.87–5.11)
RDW: 12.9 % (ref 11.5–15.5)
WBC: 7.3 10*3/uL (ref 4.0–10.5)

## 2018-02-10 LAB — HEMOGLOBIN A1C: Hgb A1c MFr Bld: 6.7 % — ABNORMAL HIGH (ref 4.6–6.5)

## 2018-02-10 NOTE — Progress Notes (Signed)
Subjective:    Patient ID: Jade FischerMary B Barber, female    DOB: 10/13/1941, 77 y.o.   MRN: [id number]  Chief Complaint  Patient presents with  . Follow-up    HPI Patient was seen today for physical.  Pt states she has been doing ok, but is tired.  Pt is taking care of her husband, also a pt of this provider, who is on hospice 2/2 pancreatic cancer.  Pt states she no longer likes leaving her husband alone.  He is to the point now where he cannot get out of bed to use the bedside commode.  Pt states her daughter from [place] is in [place] visiting.  Pt states there have been no changes in her health or medications.  She states she has been so busy with her husband that at times she forgets to eat or check her blood sugar.  She does state the last time she checked it it was "in normal range".  Pt states she is fasting today and wouldn't mind having labs.  Last mammogram was in 2018.  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  Past Medical History:  Diagnosis Date  . Coronary artery calcification seen on CAT scan   . Diabetes mellitus   . GERD (gastroesophageal reflux disease)   . History of blood transfusion    s/p transfusion  . Hx of adenomatous polyp of colon 03/11/2007  . Hyperlipidemia   . Hypertension     Allergies  Allergen Reactions  . Crestor [Rosuvastatin Calcium]     Myalgias   . Enalapril   . Lipitor [Atorvastatin]     myaglgias  . Amoxicillin Rash  . Formaldehyde Rash    ROS General: Denies fever, chills, night sweats, changes in weight, changes in appetite HEENT: Denies headaches, ear pain, changes in vision, rhinorrhea, sore throat CV: Denies CP, palpitations, SOB, orthopnea Pulm: Denies SOB, cough, wheezing GI: Denies abdominal pain, nausea, vomiting, diarrhea, constipation GU: Denies dysuria, hematuria, frequency, vaginal discharge Msk: Denies muscle cramps, joint pains Neuro: Denies weakness, numbness, tingling Skin: Denies rashes,  bruising Psych: Denies depression, anxiety, hallucinations     Objective:    Blood pressure 128/60, pulse (!) 55, temperature 97.9 F (36.6 C), temperature source Oral, weight 129 lb (58.5 kg), SpO2 96 %.   Gen. Pleasant, well-nourished, in no distress, normal affect   HEENT: [place]/AT, face symmetric, wearing glasses, no scleral icterus, PERRLA, nares patent without drainage, pharynx without erythema or exudate.  Bilateral hearing aids in place.  TMs normal. No cervical lymphadenopathy. Neck: No JVD, no thyromegaly, no carotid bruits Lungs: no accessory muscle use, CTAB, no wheezes or rales Cardiovascular: RRR, no m/r/g, no peripheral edema Abdomen: BS present, soft, NT/ND Musculoskeletal: No deformities, no cyanosis or clubbing, normal tone Neuro:  A&Ox3, CN II-XII intact, normal gait Skin:  Warm, no lesions/ rash   Wt Readings from Last 3 Encounters:  02/10/18 129 lb (58.5 kg)  11/10/17 132 lb 14.4 oz (60.3 kg)  10/31/15 133 lb 6.4 oz (60.5 kg)    No results found for: WBC, HGB, HCT, PLT, GLUCOSE, CHOL, TRIG, HDL, LDLDIRECT, LDLCALC, ALT, AST, NA, K, CL, CREATININE, BUN, CO2, TSH, PSA, INR, GLUF, HGBA1C, MICROALBUR  Assessment/Plan:  Routine health maintenance  -Anticipatory guidance given including wearing seatbelts, smoke detectors in the home, increasing physical activity -Given handout - Plan: CBC (no diff), Basic metabolic panel  Screening for cholesterol level  - Plan: Lipid panel  Controlled type 2 diabetes mellitus without complication,  without long-term current use of insulin (HCC)  -Continue metformin 1000 mg twice daily -Check FSBS daily when able -Needs diabetic retinopathy screen, however at this time pt is unable to do this given her husband's health. - Plan: Hemoglobin A1c  Essential hypertension  -Controlled -Continue Norvasc 2.5 mg, losartan 100 mg daily - Plan: Basic metabolic panel  Follow-up in the next few months, sooner if needed  Abbe Amsterdam, MD

## 2018-02-10 NOTE — Patient Instructions (Addendum)
Preventive Care 65 Years and Older, Female Preventive care refers to lifestyle choices and visits with your health care provider that can promote health and wellness. What does preventive care include?  A yearly physical exam. This is also called an annual well check.  Dental exams once or twice a year.  Routine eye exams. Ask your health care provider how often you should have your eyes checked.  Personal lifestyle choices, including: ? Daily care of your teeth and gums. ? Regular physical activity. ? Eating a healthy diet. ? Avoiding tobacco and drug use. ? Limiting alcohol use. ? Practicing safe sex. ? Taking low-dose aspirin every day. ? Taking vitamin and mineral supplements as recommended by your health care provider. What happens during an annual well check? The services and screenings done by your health care provider during your annual well check will depend on your age, overall health, lifestyle risk factors, and family history of disease. Counseling Your health care provider may ask you questions about your:  Alcohol use.  Tobacco use.  Drug use.  Emotional well-being.  Home and relationship well-being.  Sexual activity.  Eating habits.  History of falls.  Memory and ability to understand (cognition).  Work and work environment.  Reproductive health.  Screening You may have the following tests or measurements:  Height, weight, and BMI.  Blood pressure.  Lipid and cholesterol levels. These may be checked every 5 years, or more frequently if you are over 50 years old.  Skin check.  Lung cancer screening. You may have this screening every year starting at age 55 if you have a 30-pack-year history of smoking and currently smoke or have quit within the past 15 years.  Fecal occult blood test (FOBT) of the stool. You may have this test every year starting at age 50.  Flexible sigmoidoscopy or colonoscopy. You may have a sigmoidoscopy every 5 years or  a colonoscopy every 10 years starting at age 50.  Hepatitis C blood test.  Hepatitis B blood test.  Sexually transmitted disease (STD) testing.  Diabetes screening. This is done by checking your blood sugar (glucose) after you have not eaten for a while (fasting). You may have this done every 1-3 years.  Bone density scan. This is done to screen for osteoporosis. You may have this done starting at age 77.  Mammogram. This may be done every 1-2 years. Talk to your health care provider about how often you should have regular mammograms.  Talk with your health care provider about your test results, treatment options, and if necessary, the need for more tests. Vaccines Your health care provider may recommend certain vaccines, such as:  Influenza vaccine. This is recommended every year.  Tetanus, diphtheria, and acellular pertussis (Tdap, Td) vaccine. You may need a Td booster every 10 years.  Varicella vaccine. You may need this if you have not been vaccinated.  Zoster vaccine. You may need this after age 60.  Measles, mumps, and rubella (MMR) vaccine. You may need at least one dose of MMR if you were born in 1957 or later. You may also need a second dose.  Pneumococcal 13-valent conjugate (PCV13) vaccine. One dose is recommended after age 77.  Pneumococcal polysaccharide (PPSV23) vaccine. One dose is recommended after age 77.  Meningococcal vaccine. You may need this if you have certain conditions.  Hepatitis A vaccine. You may need this if you have certain conditions or if you travel or work in places where you may be exposed to hepatitis   A.  Hepatitis B vaccine. You may need this if you have certain conditions or if you travel or work in places where you may be exposed to hepatitis B.  Haemophilus influenzae type b (Hib) vaccine. You may need this if you have certain conditions.  Talk to your health care provider about which screenings and vaccines you need and how often you  need them. This information is not intended to replace advice given to you by your health care provider. Make sure you discuss any questions you have with your health care provider. Document Released: 12/27/2015 Document Revised: 08/19/2016 Document Reviewed: 10/01/2015 Elsevier Interactive Patient Education  2018 Elsevier Inc.  

## 2018-02-14 ENCOUNTER — Encounter: Payer: Self-pay | Admitting: *Deleted

## 2018-03-02 ENCOUNTER — Ambulatory Visit: Payer: Medicare Other | Admitting: Family Medicine

## 2018-03-02 ENCOUNTER — Encounter: Payer: Self-pay | Admitting: Family Medicine

## 2018-03-02 VITALS — BP 108/60 | HR 96 | Temp 98.5°F | Wt 128.6 lb

## 2018-03-02 DIAGNOSIS — K0889 Other specified disorders of teeth and supporting structures: Secondary | ICD-10-CM

## 2018-03-02 DIAGNOSIS — M545 Low back pain, unspecified: Secondary | ICD-10-CM

## 2018-03-02 DIAGNOSIS — G8929 Other chronic pain: Secondary | ICD-10-CM

## 2018-03-02 MED ORDER — DICLOFENAC SODIUM 75 MG PO TBEC: 75.0000 mg | DELAYED_RELEASE_TABLET | Freq: Two times a day (BID) | ORAL | 0 refills | Status: AC

## 2018-03-02 NOTE — Progress Notes (Signed)
   Subjective:    Patient ID: Jade FischerMary B Olexa, female    DOB: 12/01/1941, 77 y.o.   MRN: [id number]  HPI Here for 2 problems. First she has had intermittent low back pain for about one year, but lately this has gotten worse. She has stiffness and pain in the center of the lower back. No radiation to the legs. No hx of trauma. This is usually worst when she gets up out of bed or when she stands up after sitting a long time. Then as she walks around it improves. She takes Aleve or Advil for this sometimes. Heat helps a little. Also for 2 days she has had pain and swelling around the left jaw. It dies not hurt to chew. No fever. She has upper and lower partial dentures.    Review of Systems  Constitutional: Negative.   HENT: Positive for dental problem. Negative for ear pain, postnasal drip and sore throat.   Respiratory: Negative.   Cardiovascular: Negative.   Musculoskeletal: Positive for back pain.  Neurological: Negative for weakness and numbness.       Objective:   Physical Exam  Constitutional: She is oriented to person, place, and time. She appears well-developed and well-nourished.  HENT:  The left lower jaw area is swollen and tender. The TMJ is not tender. She has 3 original teeth on the left lower jaw, and she is tender around the base of the back molar. There are several tender enlarged lymph nodes under the left mandible angle   Cardiovascular: Normal rate, regular rhythm, normal heart sounds and intact distal pulses.  Pulmonary/Chest: Effort normal and breath sounds normal. No respiratory distress. She has no wheezes. She has no rales.  Musculoskeletal:  Mildly tender over the lower spine with full ROM. Negative SLR   Neurological: She is alert and oriented to person, place, and time.          Assessment & Plan:  Lower back pain, likely due to disc disease. Try Diclofenac bid as needed. She also has inflammation around the left lower molar so she will contact her dentist  tomorrow.  Gershon CraneStephen Jaton Eilers, MD

## 2018-03-04 ENCOUNTER — Telehealth: Payer: Self-pay | Admitting: Family Medicine

## 2018-03-04 ENCOUNTER — Other Ambulatory Visit: Payer: Self-pay | Admitting: Family Medicine

## 2018-03-04 MED ORDER — METFORMIN HCL 1000 MG PO TABS: 1000.0000 mg | ORAL_TABLET | Freq: Two times a day (BID) | ORAL | 1 refills | Status: DC

## 2018-03-04 NOTE — Telephone Encounter (Signed)
Medication was refilled; pt was called and made aware.

## 2018-03-04 NOTE — Telephone Encounter (Signed)
Copied from CRM [phone]. Topic: Quick Communication - Rx Refill/Question >> Mar 04, 2018 12:39 PM Alexander BergeronBarksdale, Harvey B wrote: Medication: metFORMIN (GLUCOPHAGE) 1000 MG tablet [[id number]][[id number]]  Has the patient contacted their pharmacy? Yes.   (Agent: If no, request that the patient contact the pharmacy for the refill.) Preferred [facility] (with phone number [facility]): [facility] Agent: Please be advised that RX refills may take up to 3 business days. We ask that you follow-up with your pharmacy.

## 2018-03-04 NOTE — Telephone Encounter (Signed)
On refill request for glucophage, do not have a provider on order.

## 2018-03-11 NOTE — Telephone Encounter (Signed)
Patient called and notified the [facility] [address], she says "it should've been sent to [facility] [address]. Will you remove all pharmacies from my chart except [facility]?" I advised I will update chart and call [facility] to retrieve the prescription. [facility] called and spoke to [place] who says she will retrieve the [facility] [address]

## 2018-03-11 NOTE — Telephone Encounter (Signed)
Left message for patient to call back- [facility]- verify were she wants Rx sent- she has several pharmacies listed.

## 2018-03-11 NOTE — Telephone Encounter (Signed)
[facility] pharmacy called and said they never received the refill request for the metformin. Please re-send

## 2018-03-21 DIAGNOSIS — M21962 Unspecified acquired deformity of left lower leg: Secondary | ICD-10-CM | POA: Diagnosis not present

## 2018-03-21 DIAGNOSIS — M21961 Unspecified acquired deformity of right lower leg: Secondary | ICD-10-CM | POA: Diagnosis not present

## 2018-03-21 DIAGNOSIS — R238 Other skin changes: Secondary | ICD-10-CM | POA: Diagnosis not present

## 2018-03-21 DIAGNOSIS — B353 Tinea pedis: Secondary | ICD-10-CM | POA: Diagnosis not present

## 2018-03-21 DIAGNOSIS — E1351 Other specified diabetes mellitus with diabetic peripheral angiopathy without gangrene: Secondary | ICD-10-CM | POA: Diagnosis not present

## 2018-04-12 DIAGNOSIS — B351 Tinea unguium: Secondary | ICD-10-CM | POA: Diagnosis not present

## 2018-04-12 DIAGNOSIS — E1351 Other specified diabetes mellitus with diabetic peripheral angiopathy without gangrene: Secondary | ICD-10-CM | POA: Diagnosis not present

## 2018-04-12 DIAGNOSIS — M79672 Pain in left foot: Secondary | ICD-10-CM | POA: Diagnosis not present

## 2018-04-12 DIAGNOSIS — M79671 Pain in right foot: Secondary | ICD-10-CM | POA: Diagnosis not present

## 2018-05-05 ENCOUNTER — Encounter: Payer: Self-pay | Admitting: Family Medicine

## 2018-05-05 ENCOUNTER — Ambulatory Visit: Payer: Medicare Other | Admitting: Family Medicine

## 2018-05-05 VITALS — BP 128/64 | HR 68 | Temp 97.6°F | Wt 128.0 lb

## 2018-05-05 DIAGNOSIS — J019 Acute sinusitis, unspecified: Secondary | ICD-10-CM | POA: Diagnosis not present

## 2018-05-05 DIAGNOSIS — J40 Bronchitis, not specified as acute or chronic: Secondary | ICD-10-CM

## 2018-05-05 MED ORDER — AZITHROMYCIN 250 MG PO TABS: ORAL_TABLET | ORAL | 0 refills | Status: DC

## 2018-05-05 MED ORDER — BENZONATATE 100 MG PO CAPS: 100.0000 mg | ORAL_CAPSULE | Freq: Two times a day (BID) | ORAL | 0 refills | Status: DC | PRN

## 2018-05-05 NOTE — Patient Instructions (Signed)

## 2018-05-05 NOTE — Progress Notes (Signed)
Subjective:    Patient ID: Jade Barber, female    DOB: 1941/03/23, 77 y.o.   MRN: [id number]  No chief complaint on file.   HPI Patient was seen today for acute concern.  Pt endorses nasal congestion, sore throat, dry cough rhinorrhea x1 week or more.  Pt notes ear pressure and mild ear pain/pressure this morning.  Pt has tried mucinex with no relief.  Pt denies fever, chills, N/V.  Pt states she has been doing ok since her husband Rosanne Ashing died.  Pt states she surprisingly has been able to sleep.  Pt expresses appreciation that this provider called her during that time.   Past Medical History:  Diagnosis Date  . Coronary artery calcification seen on CAT scan   . Diabetes mellitus   . GERD (gastroesophageal reflux disease)   . History of blood transfusion    s/p transfusion  . Hx of adenomatous polyp of colon 03/11/2007  . Hyperlipidemia   . Hypertension     Allergies  Allergen Reactions  . Crestor [Rosuvastatin Calcium]     Myalgias   . Enalapril   . Lipitor [Atorvastatin]     myaglgias  . Amoxicillin Rash  . Formaldehyde Rash    ROS General: Denies fever, chills, night sweats, changes in weight, changes in appetite HEENT: Denies headaches, changes in vision,     + rhinorrhea, sore throat, nasal congestion, ear pain/pressure CV: Denies CP, palpitations, SOB, orthopnea Pulm: Denies SOB, wheezing  +cough GI: Denies abdominal pain, nausea, vomiting, diarrhea, constipation GU: Denies dysuria, hematuria, frequency, vaginal discharge Msk: Denies muscle cramps, joint pains Neuro: Denies weakness, numbness, tingling Skin: Denies rashes, bruising Psych: Denies depression, anxiety, hallucinations     Objective:    Blood pressure 128/64, pulse 68, temperature 97.6 F (36.4 C), temperature source Oral, weight 128 lb (58.1 kg), SpO2 98 %.   Gen. Pleasant, well-nourished, in no distress, normal affect   HEENT: [place]/AT, face symmetric, no scleral icterus, PERRLA, nares patent with  clear drainage, pharynx with mild erythema, no exudate.  TMs full bilaterally.  Mild cervical lymphadenopathy. Lungs: Dry deep cough, no accessory muscle use, CTAB, no wheezes or rales Cardiovascular: RRR, no m/r/g, no peripheral edema Neuro:  A&Ox3, CN II-XII intact, normal gait   Wt Readings from Last 3 Encounters:  03/02/18 128 lb 9.6 oz (58.3 kg)  02/10/18 129 lb (58.5 kg)  11/10/17 132 lb 14.4 oz (60.3 kg)    Lab Results  Component Value Date   WBC 7.3 02/10/2018   HGB 13.9 02/10/2018   HCT 40.5 02/10/2018   PLT 220.0 02/10/2018   GLUCOSE 92 02/10/2018   CHOL 245 (H) 02/10/2018   TRIG 162.0 (H) 02/10/2018   HDL 39.30 02/10/2018   LDLCALC 173 (H) 02/10/2018   NA 142 02/10/2018   K 4.1 02/10/2018   CL 105 02/10/2018   CREATININE 0.87 02/10/2018   BUN 18 02/10/2018   CO2 31 02/10/2018   HGBA1C 6.7 (H) 02/10/2018    Assessment/Plan:  Acute non-recurrent sinusitis, unspecified location -Discussed supportive care -Given handout - Plan: azithromycin (ZITHROMAX) 250 MG tablet  Bronchitis -Discussed various treatment options.  Pt declines short course of prednisone at this time and wishes to wait on inhaler. -Patient if her symptoms continue or she has shortness of breath to notify clinic.  If needed we will send in Rx for inhaler. - Plan: azithromycin (ZITHROMAX) 250 MG tablet, benzonatate (TESSALON) 100 MG capsule  Follow-up PRN  Abbe Amsterdam, MD

## 2018-05-10 ENCOUNTER — Encounter: Payer: Self-pay | Admitting: Family Medicine

## 2018-05-10 ENCOUNTER — Ambulatory Visit: Payer: Medicare Other | Admitting: Family Medicine

## 2018-05-10 VITALS — BP 120/70 | HR 70 | Temp 98.3°F | Wt 128.3 lb

## 2018-05-10 DIAGNOSIS — J40 Bronchitis, not specified as acute or chronic: Secondary | ICD-10-CM

## 2018-05-10 DIAGNOSIS — R0982 Postnasal drip: Secondary | ICD-10-CM

## 2018-05-10 MED ORDER — FLUTICASONE PROPIONATE 50 MCG/ACT NA SUSP: 1.0000 | Freq: Every day | NASAL | 1 refills | Status: AC

## 2018-05-10 MED ORDER — BENZONATATE 100 MG PO CAPS
100.0000 mg | ORAL_CAPSULE | Freq: Two times a day (BID) | ORAL | 0 refills | Status: DC | PRN
Start: 2018-05-10 — End: 2019-01-17

## 2018-05-10 NOTE — Progress Notes (Signed)
Subjective:    Patient ID: Jade Barber, female    DOB: February 01, 1941, 77 y.o.   MRN: [id number]  Chief Complaint  Patient presents with  . Cough  . Sinusitis    HPI Patient was seen today for ongoing concern.  Patient was seen on 05/05/2018 dx'd with bronchitis/sinusitis given azithromycin and Tessalon Perles.  Patient returns today for continued cough, though states she is feeling better.  Pt states the tessalon does help, but she is almost out.  Pt is suppose to fly to [place] on Thursday for her grandson's graduation. Pt denies fever, chills, n/v.   Pt has been using a netti pot with good results.  Pt also notes occasional hoarse voice and rhinorrhea.  Past Medical History:  Diagnosis Date  . Coronary artery calcification seen on CAT scan   . Diabetes mellitus   . GERD (gastroesophageal reflux disease)   . History of blood transfusion    s/p transfusion  . Hx of adenomatous polyp of colon 03/11/2007  . Hyperlipidemia   . Hypertension     Allergies  Allergen Reactions  . Crestor [Rosuvastatin Calcium]     Myalgias   . Enalapril   . Lipitor [Atorvastatin]     myaglgias  . Amoxicillin Rash  . Formaldehyde Rash    ROS General: Denies fever, chills, night sweats, changes in weight, changes in appetite HEENT: Denies headaches, ear pain, changes in vision, rhinorrhea, sore throat  +nasal drainage, rhinorrha-improving, hoarseness. CV: Denies CP, palpitations, SOB, orthopnea Pulm: Denies SOB, wheezing  +cough GI: Denies abdominal pain, nausea, vomiting, diarrhea, constipation GU: Denies dysuria, hematuria, frequency, vaginal discharge Msk: Denies muscle cramps, joint pains Neuro: Denies weakness, numbness, tingling Skin: Denies rashes, bruising Psych: Denies depression, anxiety, hallucinations     Objective:    Blood pressure 120/70, pulse 70, temperature 98.3 F (36.8 C), temperature source Oral, weight 128 lb 4.8 oz (58.2 kg), SpO2 97 %.   Gen. Pleasant, well-nourished,  in no distress, normal affect   HEENT: [place]/AT, face symmetric, conjunctiva clear, no scleral icterus, PERRLA, nares patent without drainage, pharynx with post nasal drainage, no erythema or exudate.  Hearing aids in place. Neck: No JVD, no thyromegaly Lungs: no accessory muscle useC, TAB, no wheezes or rales.  Productive sounding cough. Cardiovascular: RRR, no m/r/g, no peripheral edema Neuro:  A&Ox3, CN II-XII intact, normal gait   Wt Readings from Last 3 Encounters:  05/10/18 128 lb 4.8 oz (58.2 kg)  05/05/18 128 lb (58.1 kg)  03/02/18 128 lb 9.6 oz (58.3 kg)    Lab Results  Component Value Date   WBC 7.3 02/10/2018   HGB 13.9 02/10/2018   HCT 40.5 02/10/2018   PLT 220.0 02/10/2018   GLUCOSE 92 02/10/2018   CHOL 245 (H) 02/10/2018   TRIG 162.0 (H) 02/10/2018   HDL 39.30 02/10/2018   LDLCALC 173 (H) 02/10/2018   NA 142 02/10/2018   K 4.1 02/10/2018   CL 105 02/10/2018   CREATININE 0.87 02/10/2018   BUN 18 02/10/2018   CO2 31 02/10/2018   HGBA1C 6.7 (H) 02/10/2018    Assessment/Plan:  Postnasal drip  -discussed ok to continue using Netti pot or OTC saline nasal spray -discussed proper nasal spray use. - Plan: fluticasone (FLONASE) 50 MCG/ACT nasal spray  Bronchitis  -discussed cough can linger for several wks. -pt reassured based on exam. -pt given RTC or ED precautions including worsening symptoms, fever, chills, weakness, etc -pt encouraged to stay hydrated and eat. - Plan: benzonatate (TESSALON)  100 MG capsule F/u prn  Abbe Amsterdam, MD

## 2018-05-10 NOTE — Patient Instructions (Addendum)
I have sent in a prescription for flonase and tessalon to your pharmacy.  If you would rather use the saline nasal spray, that is fine.  It can be found over the counter at the drug store.  Postnasal Drip Postnasal drip is the feeling of mucus going down the back of your throat. Mucus is a slimy substance that moistens and cleans your nose and throat, as well as the air pockets in face bones near your forehead and cheeks (sinuses). Small amounts of mucus pass from your nose and sinuses down the back of your throat all the time. This is normal. When you produce too much mucus or the mucus gets too thick, you can feel it. Some common causes of postnasal drip include:  Having more mucus because of: ? A cold or the flu. ? Allergies. ? Cold air. ? Certain medicines.  Having more mucus that is thicker because of: ? A sinus or nasal infection. ? Dry air. ? A food allergy.  Follow these instructions at home: Relieving discomfort  Gargle with a salt-water mixture 3-4 times a day or as needed. To make a salt-water mixture, completely dissolve -1 tsp of salt in 1 cup of warm water.  If the air in your home is dry, use a humidifier to add moisture to the air.  Use a saline spray or container (neti pot) to flush out the nose (nasal irrigation). These methods can help clear away mucus and keep the nasal passages moist. General instructions  Take over-the-counter and prescription medicines only as told by your health care provider.  Follow instructions from your health care provider about eating or drinking restrictions. You may need to avoid caffeine.  Avoid things that you know you are allergic to (allergens), like dust, mold, pollen, pets, or certain foods.  Drink enough fluid to keep your urine pale yellow.  Keep all follow-up visits as told by your health care provider. This is important. Contact a health care provider if:  You have a fever.  You have a sore throat.  You have  difficulty swallowing.  You have headache.  You have sinus pain.  You have a cough that does not go away.  The mucus from your nose becomes thick and is green or yellow in color.  You have cold or flu symptoms that last more than 10 days. Summary  Postnasal drip is the feeling of mucus going down the back of your throat.  If your health care provider approves, use nasal irrigation or a nasal spray 2?4 times a day.  Avoid things that you know you are allergic to (allergens), like dust, mold, pollen, pets, or certain foods. This information is not intended to replace advice given to you by your health care provider. Make sure you discuss any questions you have with your health care provider. Document Released: 03/15/2017 Document Revised: 03/15/2017 Document Reviewed: 03/15/2017 Elsevier Interactive Patient Education  [employer].

## 2018-07-12 DIAGNOSIS — B351 Tinea unguium: Secondary | ICD-10-CM | POA: Diagnosis not present

## 2018-07-12 DIAGNOSIS — E1151 Type 2 diabetes mellitus with diabetic peripheral angiopathy without gangrene: Secondary | ICD-10-CM | POA: Diagnosis not present

## 2018-07-12 DIAGNOSIS — M898X7 Other specified disorders of bone, ankle and foot: Secondary | ICD-10-CM | POA: Diagnosis not present

## 2018-07-12 DIAGNOSIS — M79671 Pain in right foot: Secondary | ICD-10-CM | POA: Diagnosis not present

## 2018-08-18 ENCOUNTER — Encounter: Payer: Self-pay | Admitting: Family Medicine

## 2018-08-18 ENCOUNTER — Ambulatory Visit: Payer: Medicare Other | Admitting: Family Medicine

## 2018-08-18 VITALS — BP 128/60 | HR 80 | Temp 97.4°F | Wt 124.0 lb

## 2018-08-18 DIAGNOSIS — I10 Essential (primary) hypertension: Secondary | ICD-10-CM | POA: Diagnosis not present

## 2018-08-18 DIAGNOSIS — E782 Mixed hyperlipidemia: Secondary | ICD-10-CM | POA: Diagnosis not present

## 2018-08-18 DIAGNOSIS — R5383 Other fatigue: Secondary | ICD-10-CM | POA: Diagnosis not present

## 2018-08-18 DIAGNOSIS — E119 Type 2 diabetes mellitus without complications: Secondary | ICD-10-CM | POA: Diagnosis not present

## 2018-08-18 LAB — LIPID PANEL
CHOLESTEROL: 146 mg/dL (ref 0–200)
HDL: 37 mg/dL — ABNORMAL LOW (ref >39.00)
LDL CALC: 77 mg/dL (ref 0–99)
NonHDL: 108.95
Total CHOL/HDL Ratio: 4
Triglycerides: 161 mg/dL — ABNORMAL HIGH (ref 0.0–149.0)
VLDL: 32.2 mg/dL (ref 0.0–40.0)

## 2018-08-18 LAB — TSH: TSH: 1.11 u[IU]/mL (ref 0.35–4.50)

## 2018-08-18 LAB — CBC
HEMATOCRIT: 38.9 % (ref 36.0–46.0)
HEMOGLOBIN: 13.1 g/dL (ref 12.0–15.0)
MCHC: 33.6 g/dL (ref 30.0–36.0)
MCV: 90.2 fl (ref 78.0–100.0)
Platelets: 224 10*3/uL (ref 150.0–400.0)
RBC: 4.31 Mil/uL (ref 3.87–5.11)
RDW: 13.5 % (ref 11.5–15.5)
WBC: 5.7 10*3/uL (ref 4.0–10.5)

## 2018-08-18 LAB — HEMOGLOBIN A1C: HEMOGLOBIN A1C: 6.5 % (ref 4.6–6.5)

## 2018-08-18 LAB — VITAMIN D 25 HYDROXY (VIT D DEFICIENCY, FRACTURES): VITD: 43.32 ng/mL (ref 30.00–100.00)

## 2018-08-18 LAB — T4, FREE: Free T4: 0.81 ng/dL (ref 0.60–1.60)

## 2018-08-18 NOTE — Progress Notes (Signed)
Subjective:    Patient ID: Jade Barber, female    DOB: [birth year], 77 y.o.   MRN: [id number]  No chief complaint on file.   HPI Patient was seen today for f/u.  Pt states she has been doing ok since her husband's death.  She states her children have come to visit and she has good support through her church.  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  HTN: -Taking losartan 100 mg daily and Norvasc 2.5 mg daily  HLD: -Taking Zocor 40 mg daily -Patient endorses occasional soreness but attributes it more to age than her medication -In the past took Crestor, had myalgias  DM: -Patient taking metformin 1000 mg twice daily -Occasionally checks fasting blood sugar, 100-1 20s.  Fatigue: -pt endorses taking 1-2 hour naps during the day -goes to bed at 11 pm, may wake up at 1 or 2 am.  Will stay up for an hour then go back to bed until 8:30 or so. -pt has been told she snores in the past. -unsure if related to her husband's death.  Past Medical History:  Diagnosis Date  . Coronary artery calcification seen on CAT scan   . Diabetes mellitus   . GERD (gastroesophageal reflux disease)   . History of blood transfusion    s/p transfusion  . Hx of adenomatous polyp of colon 03/11/2007  . Hyperlipidemia   . Hypertension     Allergies  Allergen Reactions  . Crestor [Rosuvastatin Calcium]     Myalgias   . Enalapril   . Lipitor [Atorvastatin]     myaglgias  . Amoxicillin Rash  . Formaldehyde Rash    ROS General: Denies fever, chills, night sweats, changes in weight, changes in appetite  +fatigue HEENT: Denies headaches, ear pain, changes in vision, rhinorrhea, sore throat CV: Denies CP, palpitations, SOB, orthopnea Pulm: Denies SOB, cough, wheezing GI: Denies abdominal pain, nausea, vomiting, diarrhea, constipation GU: Denies dysuria, hematuria, frequency, vaginal discharge Msk: Denies muscle cramps, joint pains Neuro: Denies weakness, numbness, tingling Skin: Denies  rashes, bruising Psych: Denies depression, anxiety, hallucinations     Objective:    Blood pressure 128/60, pulse 80, temperature (!) 97.4 F (36.3 C), temperature source Oral, weight 124 lb (56.2 kg), SpO2 97 %.   Gen. Pleasant, well-nourished, in no distress, normal affect   HEENT: [place]/AT, face symmetric, no scleral icterus, PERRLA, nares patent without drainage Lungs: no accessory muscle use, CTAB, no wheezes or rales Cardiovascular: RRR, no m/r/g, no peripheral edema Abdomen: BS present, soft, NT/ND. Neuro:  A&Ox3, CN II-XII intact, normal gait Skin:  Warm, no lesions/ rash Diabetic Foot Exam - Simple   Simple Foot Form Diabetic Foot exam was performed with the following findings:  Yes 08/18/2018  9:12 AM  Visual Inspection No deformities, no ulcerations, no other skin breakdown bilaterally:  Yes Sensation Testing Pulse Check Posterior Tibialis and Dorsalis pulse intact bilaterally:  Yes Comments L foot with slightly decreased fine sensation compared to R.  R foot with mild callous on plantar surface. Mild onychomycosis of R great toe.      Wt Readings from Last 3 Encounters:  05/10/18 128 lb 4.8 oz (58.2 kg)  05/05/18 128 lb (58.1 kg)  03/02/18 128 lb 9.6 oz (58.3 kg)    Lab Results  Component Value Date   WBC 7.3 02/10/2018   HGB 13.9 02/10/2018   HCT 40.5 02/10/2018   PLT 220.0 02/10/2018   GLUCOSE 92 02/10/2018   CHOL 245 (H) 02/10/2018  TRIG 162.0 (H) 02/10/2018   HDL 39.30 02/10/2018   LDLCALC 173 (H) 02/10/2018   NA 142 02/10/2018   K 4.1 02/10/2018   CL 105 02/10/2018   CREATININE 0.87 02/10/2018   BUN 18 02/10/2018   CO2 31 02/10/2018   HGBA1C 6.7 (H) 02/10/2018    Assessment/Plan:  Essential hypertension -controlled -Continue Norvasc 2.5 mg and losartan 100 mg daily -Continue lifestyle modifications  Mixed hyperlipidemia  -Continue Zocor 40 mg daily - Plan: Lipid panel  Controlled type 2 diabetes mellitus without complication, without  long-term current use of insulin (HCC)  -Controlled -Last hemoglobin A1c 6.7% on 02/10/2018 -Continue metformin 1000 mg twice daily -Continue checking FSBS daily - Plan: Hemoglobin A1c  Fatigue, unspecified type  -Possibly 2/2 grieving process, vitamin deficiency, anemia, thyroid -We will obtain labs - Plan: CBC (no diff), Vitamin D, 25-hydroxy, TSH, T4, free  Follow-up PRN in the next few months  Abbe Amsterdam, MD

## 2018-08-18 NOTE — Patient Instructions (Signed)
Diabetes Mellitus and Standards of Medical Care Managing diabetes (diabetes mellitus) can be complicated. Your diabetes treatment may be managed by a team of health care providers, including:  A diet and nutrition specialist (registered dietitian).  A nurse.  A certified diabetes educator (CDE).  A diabetes specialist (endocrinologist).  An eye doctor.  A primary care provider.  A dentist.  Your health care providers follow a schedule in order to help you get the best quality of care. The following schedule is a general guideline for your diabetes management plan. Your health care providers may also give you more specific instructions. HbA1c ( hemoglobin A1c) test This test provides information about blood sugar (glucose) control over the previous 2-3 months. It is used to check whether your diabetes management plan needs to be adjusted.  If you are meeting your treatment goals, this test is done at least 2 times a year.  If you are not meeting treatment goals or if your treatment goals have changed, this test is done 4 times a year.  Blood pressure test  This test is done at every routine medical visit. For most people, the goal is less than 130/80. Ask your health care provider what your goal blood pressure should be. Dental and eye exams  Visit your dentist two times a year.  If you have type 1 diabetes, get an eye exam 3-5 years after you are diagnosed, and then once a year after your first exam. ? If you were diagnosed with type 1 diabetes as a child, get an eye exam when you are age 16 or older and have had diabetes for 3-5 years. After the first exam, you should get an eye exam once a year.  If you have type 2 diabetes, have an eye exam as soon as you are diagnosed, and then once a year after your first exam. Foot care exam  Visual foot exams are done at every routine medical visit. The exams check for cuts, bruises, redness, blisters, sores, or other problems with the  feet.  A complete foot exam is done by your health care provider once a year. This exam includes an inspection of the structure and skin of your feet, and a check of the pulses and sensation in your feet. ? Type 1 diabetes: Get your first exam 3-5 years after diagnosis. ? Type 2 diabetes: Get your first exam as soon as you are diagnosed.  Check your feet every day for cuts, bruises, redness, blisters, or sores. If you have any of these or other problems that are not healing, contact your health care provider. Kidney function test ( urine microalbumin)  This test is done once a year. ? Type 1 diabetes: Get your first test 5 years after diagnosis. ? Type 2 diabetes: Get your first test as soon as you are diagnosed.  If you have chronic kidney disease (CKD), get a serum creatinine and estimated glomerular filtration rate (eGFR) test once a year. Lipid profile (cholesterol, HDL, LDL, triglycerides)  This test should be done when you are diagnosed with diabetes, and every 5 years after the first test. If you are on medicines to lower your cholesterol, you may need to get this test done every year. ? The goal for LDL is less than 100 mg/dL (5.5 mmol/L). If you are at high risk, the goal is less than 70 mg/dL (3.9 mmol/L). ? The goal for HDL is 40 mg/dL (2.2 mmol/L) for men and 50 mg/dL(2.8 mmol/L) for women. An HDL  cholesterol of 60 mg/dL (3.3 mmol/L) or higher gives some protection against heart disease. ? The goal for triglycerides is less than 150 mg/dL (8.3 mmol/L). Immunizations  The yearly flu (influenza) vaccine is recommended for everyone 6 months or older who has diabetes.  The pneumonia (pneumococcal) vaccine is recommended for everyone 2 years or older who has diabetes. If you are 33 or older, you may get the pneumonia vaccine as a series of two separate shots.  The hepatitis B vaccine is recommended for adults shortly after they have been diagnosed with diabetes.  The Tdap  (tetanus, diphtheria, and pertussis) vaccine should be given: ? According to normal childhood vaccination schedules, for children. ? Every 10 years, for adults who have diabetes.  The shingles vaccine is recommended for people who have had chicken pox and are 50 years or older. Mental and emotional health  Screening for symptoms of eating disorders, anxiety, and depression is recommended at the time of diagnosis and afterward as needed. If your screening shows that you have symptoms (you have a positive screening result), you may need further evaluation and be referred to a mental health care provider. Diabetes self-management education  Education about how to manage your diabetes is recommended at diagnosis and ongoing as needed. Treatment plan  Your treatment plan will be reviewed at every medical visit. Summary  Managing diabetes (diabetes mellitus) can be complicated. Your diabetes treatment may be managed by a team of health care providers.  Your health care providers follow a schedule in order to help you get the best quality of care.  Standards of care including having regular physical exams, blood tests, blood pressure monitoring, immunizations, screening tests, and education about how to manage your diabetes.  Your health care providers may also give you more specific instructions based on your individual health. This information is not intended to replace advice given to you by your health care provider. Make sure you discuss any questions you have with your health care provider. Document Released: 09/27/2009 Document Revised: 08/28/2016 Document Reviewed: 08/28/2016 Elsevier Interactive Patient Education  2018 [employer].  How to Avoid Diabetes Mellitus Problems You can take action to prevent or slow down problems that are caused by diabetes (diabetes mellitus). Following your diabetes plan and taking care of yourself can reduce your risk of serious or life-threatening  complications. Manage your diabetes  Follow instructions from your health care providers about managing your diabetes. Your diabetes may be managed by a team of health care providers who can teach you how to care for yourself and can answer questions that you have.  Educate yourself about your condition so you can make healthy choices about eating and physical activity.  Check your blood sugar (glucose) levels as often as directed. Your health care provider will help you decide how often to check your blood glucose level depending on your treatment goals and how well you are meeting them.  Ask your health care provider if you should take low-dose aspirin daily and what dose is recommended for you. Taking low-dose aspirin daily is recommended to help prevent cardiovascular disease. Do not use nicotine or tobacco Do not use any products that contain nicotine or tobacco, such as cigarettes and e-cigarettes. If you need help quitting, ask your health care provider. Nicotine raises your risk for diabetes problems. If you quit using nicotine:  You will lower your risk for heart attack, stroke, nerve disease, and kidney disease.  Your cholesterol and blood pressure may improve.  Your  blood circulation will improve.  Keep your blood pressure under control To control your blood pressure:  Follow instructions from your health care provider about meal planning, exercise, and medicines.  Make sure your health care provider checks your blood pressure at every medical visit.  A blood pressure reading consists of two numbers. Generally, the goal is to keep your top number (systolic pressure) at or below 130, and your bottom number (diastolic pressure) at or below 80. Your health care provider may recommend a lower target blood pressure. Your individualized target blood pressure is determined based on:  Your age.  Your medicines.  How long you have had diabetes.  Any other medical conditions you  have.  Keep your cholesterol under control To control your cholesterol:  Follow instructions from your health care provider about meal planning, exercise, and medicines.  Have your cholesterol checked at least once a year.  You may be prescribed medicine to lower cholesterol (statin). If you are not taking a statin, ask your health care provider if you should be.  Controlling your cholesterol may:  Help prevent heart disease and stroke. These are the most common health problems for people with diabetes.  Improve your blood flow.  Schedule and keep yearly physical exams and eye exams Your health care provider will tell you how often you need medical visits depending on your diabetes management plan. Keep all follow-up visits as directed. This is important so possible problems can be identified early and complications can be avoided or treated.  Every visit with your health care provider should include measuring your: ? Weight. ? Blood pressure. ? Blood glucose control.  Your A1c (hemoglobin A1c) level should be checked: ? At least 2 times a year, if you are meeting your treatment goals. ? 4 times a year, if you are not meeting treatment goals or if your treatment goals have changed.  Your blood lipids (lipid profile) should be checked yearly. You should also be checked yearly for protein in your urine (urine microalbumin).  If you have type 1 diabetes, get an eye exam 3-5 years after you are diagnosed, and then once a year after your first exam.  If you have type 2 diabetes, get an eye exam as soon as you are diagnosed, and then once a year after your first exam.  Keep your vaccines current It is recommended that you receive:  A flu (influenza) vaccine every year.  A pneumonia (pneumococcal) vaccine and a hepatitis B vaccine. If you are age 51 or older, you may get the pneumonia vaccine as a series of two separate shots.  Ask your health care provider which other vaccines may  be recommended. Take care of your feet Diabetes may cause you to have poor blood circulation to your legs and feet. Because of this, taking care of your feet is very important. Diabetes can cause:  The skin on the feet to get thinner, break more easily, and heal more slowly.  Nerve damage in your legs and feet, which results in decreased feeling. You may not notice minor injuries that could lead to serious problems.  To avoid foot problems:  Check your skin and feet every day for cuts, bruises, redness, blisters, or sores.  Schedule a foot exam with your health care provider once every year. This exam includes: ? Inspecting of the structure and skin of your feet. ? Checking the pulses and sensation in your feet.  Make sure that your health care provider performs a visual foot  exam at every medical visit.  Take care of your teeth People with poorly controlled diabetes are more likely to have gum (periodontal) disease. Diabetes can make periodontal diseases harder to control. If not treated, periodontal diseases can lead to tooth loss. To prevent this:  Brush your teeth twice a day.  Floss at least once a day.  Visit your dentist 2 times a year.  Drink responsibly Limit alcohol intake to no more than 1 drink a day for nonpregnant women and 2 drinks a day for men. One drink equals 12 oz of beer, 5 oz of wine, or 1 oz of hard liquor. It is important to eat food when you drink alcohol to avoid low blood glucose (hypoglycemia). Avoid alcohol if you:  Have a history of alcohol abuse or dependence.  Are pregnant.  Have liver disease, pancreatitis, advanced neuropathy, or severe hypertriglyceridemia.  Lessen stress Living with diabetes can be stressful. When you are experiencing stress, your blood glucose may be affected in two ways:  Stress hormones may cause your blood glucose to rise.  You may be distracted from taking good care of yourself.  Be aware of your stress level and  make changes to help you manage challenging situations. To lower your stress levels:  Consider joining a support group.  Do planned relaxation or meditation.  Do a hobby that you enjoy.  Maintain healthy relationships.  Exercise regularly.  Work with your health care provider or a mental health professional.  Summary  You can take action to prevent or slow down problems that are caused by diabetes (diabetes mellitus). Following your diabetes plan and taking care of yourself can reduce your risk of serious or life-threatening complications.  Follow instructions from your health care providers about managing your diabetes. Your diabetes may be managed by a team of health care providers who can teach you how to care for yourself and can answer questions that you have.  Your health care provider will tell you how often you need medical visits depending on your diabetes management plan. Keep all follow-up visits as directed. This is important so possible problems can be identified early and complications can be avoided or treated. This information is not intended to replace advice given to you by your health care provider. Make sure you discuss any questions you have with your health care provider. Document Released: 08/18/2011 Document Revised: 08/29/2016 Document Reviewed: 08/29/2016 Elsevier Interactive Patient Education  2018 [employer].  High Cholesterol High cholesterol is a condition in which the blood has high levels of a white, waxy, fat-like substance (cholesterol). The human body needs small amounts of cholesterol. The liver makes all the cholesterol that the body needs. Extra (excess) cholesterol comes from the food that we eat. Cholesterol is carried from the liver by the blood through the blood vessels. If you have high cholesterol, deposits (plaques) may build up on the walls of your blood vessels (arteries). Plaques make the arteries narrower and stiffer. Cholesterol plaques  increase your risk for heart attack and stroke. Work with your health care provider to keep your cholesterol levels in a healthy range. What increases the risk? This condition is more likely to develop in people who:  Eat foods that are high in animal fat (saturated fat) or cholesterol.  Are overweight.  Are not getting enough exercise.  Have a family history of high cholesterol.  What are the signs or symptoms? There are no symptoms of this condition. How is this diagnosed? This condition may  be diagnosed from the results of a blood test.  If you are older than age 22, your health care provider may check your cholesterol every 4-6 years.  You may be checked more often if you already have high cholesterol or other risk factors for heart disease.  The blood test for cholesterol measures:  "Bad" cholesterol (LDL cholesterol). This is the main type of cholesterol that causes heart disease. The desired level for LDL is less than 100.  "Good" cholesterol (HDL cholesterol). This type helps to protect against heart disease by cleaning the arteries and carrying the LDL away. The desired level for HDL is 60 or higher.  Triglycerides. These are fats that the body can store or burn for energy. The desired number for triglycerides is lower than 150.  Total cholesterol. This is a measure of the total amount of cholesterol in your blood, including LDL cholesterol, HDL cholesterol, and triglycerides. A healthy number is less than 200.  How is this treated? This condition is treated with diet changes, lifestyle changes, and medicines. Diet changes  This may include eating more whole grains, fruits, vegetables, nuts, and fish.  This may also include cutting back on red meat and foods that have a lot of added sugar. Lifestyle changes  Changes may include getting at least 40 minutes of aerobic exercise 3 times a week. Aerobic exercises include walking, biking, and swimming. Aerobic exercise  along with a healthy diet can help you maintain a healthy weight.  Changes may also include quitting smoking. Medicines  Medicines are usually given if diet and lifestyle changes have failed to reduce your cholesterol to healthy levels.  Your health care provider may prescribe a statin medicine. Statin medicines have been shown to reduce cholesterol, which can reduce the risk of heart disease. Follow these instructions at home: Eating and drinking  If told by your health care provider:  Eat chicken (without skin), fish, veal, shellfish, ground [place] breast, and round or loin cuts of red meat.  Do not eat fried foods or fatty meats, such as hot dogs and salami.  Eat plenty of fruits, such as apples.  Eat plenty of vegetables, such as broccoli, potatoes, and carrots.  Eat beans, peas, and lentils.  Eat grains such as barley, rice, couscous, and bulgur wheat.  Eat pasta without cream sauces.  Use skim or nonfat milk, and eat low-fat or nonfat yogurt and cheeses.  Do not eat or drink whole milk, cream, ice cream, egg yolks, or hard cheeses.  Do not eat stick margarine or tub margarines that contain trans fats (also called partially hydrogenated oils).  Do not eat saturated tropical oils, such as coconut oil and palm oil.  Do not eat cakes, cookies, crackers, or other baked goods that contain trans fats.  General instructions  Exercise as directed by your health care provider. Increase your activity level with activities such as gardening, walking, and taking the stairs.  Take over-the-counter and prescription medicines only as told by your health care provider.  Do not use any products that contain nicotine or tobacco, such as cigarettes and e-cigarettes. If you need help quitting, ask your health care provider.  Keep all follow-up visits as told by your health care provider. This is important. Contact a health care provider if:  You are struggling to maintain a healthy  diet or weight.  You need help to start on an exercise program.  You need help to stop smoking. Get help right away if:  You have  chest pain.  You have trouble breathing. This information is not intended to replace advice given to you by your health care provider. Make sure you discuss any questions you have with your health care provider. Document Released: 11/30/2005 Document Revised: 06/27/2016 Document Reviewed: 05/30/2016 Elsevier Interactive Patient Education  [employer].

## 2018-08-25 ENCOUNTER — Other Ambulatory Visit: Payer: Self-pay | Admitting: Family Medicine

## 2018-08-30 DIAGNOSIS — L03031 Cellulitis of right toe: Secondary | ICD-10-CM | POA: Diagnosis not present

## 2018-08-30 DIAGNOSIS — L02611 Cutaneous abscess of right foot: Secondary | ICD-10-CM | POA: Diagnosis not present

## 2018-08-30 DIAGNOSIS — M79674 Pain in right toe(s): Secondary | ICD-10-CM | POA: Diagnosis not present

## 2018-09-13 DIAGNOSIS — L03031 Cellulitis of right toe: Secondary | ICD-10-CM | POA: Diagnosis not present

## 2018-10-18 DIAGNOSIS — M79671 Pain in right foot: Secondary | ICD-10-CM | POA: Diagnosis not present

## 2018-10-18 DIAGNOSIS — M79672 Pain in left foot: Secondary | ICD-10-CM | POA: Diagnosis not present

## 2018-10-18 DIAGNOSIS — B351 Tinea unguium: Secondary | ICD-10-CM | POA: Diagnosis not present

## 2018-10-18 DIAGNOSIS — E1151 Type 2 diabetes mellitus with diabetic peripheral angiopathy without gangrene: Secondary | ICD-10-CM | POA: Diagnosis not present

## 2018-11-18 ENCOUNTER — Ambulatory Visit: Payer: Medicare Other | Admitting: Family Medicine

## 2018-11-18 ENCOUNTER — Encounter: Payer: Self-pay | Admitting: Family Medicine

## 2018-11-18 VITALS — BP 110/70 | HR 54 | Temp 97.8°F | Wt 126.0 lb

## 2018-11-18 DIAGNOSIS — F4321 Adjustment disorder with depressed mood: Secondary | ICD-10-CM | POA: Diagnosis not present

## 2018-11-18 DIAGNOSIS — Z23 Encounter for immunization: Secondary | ICD-10-CM

## 2018-11-18 NOTE — Patient Instructions (Signed)

## 2018-11-18 NOTE — Progress Notes (Signed)
Subjective:    Patient ID: Jade Barber, female    DOB: 06/07/1941, 77 y.o.   MRN: [id number]  No chief complaint on file.   HPI Patient was seen today for ongoing concern.  Pt endorses feeling tired/having no appetite times.  Since the death of her husband in April, pt notes it is hard to cook for one person.  Pt has a good local support system including a friend from church, who is also a widow and church members  [place]  Pt speaks to her daughters via phone or the computer.  Pt goes out to eat with her friend once a week, volunteers at a school, and works with [employer].  Pt is still teaching dance class once/wk.  Pt states surprisingly her sleep is better since her husband's death.  Energy and mood are ok.    Pt recently had teeth extracted and lower dentures made.  Pt is still adjusting to the dentures.  She notes they become loose when she says certain sounds.  Pt had upper dentures since her late teens.   Past Medical History:  Diagnosis Date  . Coronary artery calcification seen on CAT scan   . Diabetes mellitus   . GERD (gastroesophageal reflux disease)   . History of blood transfusion    s/p transfusion  . Hx of adenomatous polyp of colon 03/11/2007  . Hyperlipidemia   . Hypertension     Allergies  Allergen Reactions  . Crestor [Rosuvastatin Calcium]     Myalgias   . Enalapril   . Lipitor [Atorvastatin]     myaglgias  . Amoxicillin Rash  . Formaldehyde Rash    ROS General: Denies fever, chills, night sweats, changes in weight  +tired and changes in appetite HEENT: Denies headaches, ear pain, changes in vision, rhinorrhea, sore throat CV: Denies CP, palpitations, SOB, orthopnea Pulm: Denies SOB, cough, wheezing GI: Denies abdominal pain, nausea, vomiting, diarrhea, constipation GU: Denies dysuria, hematuria, frequency, vaginal discharge Msk: Denies muscle cramps, joint pains Neuro: Denies weakness, numbness,  tingling Skin: Denies rashes, bruising Psych: Denies anxiety, hallucinations  +depressed mood      Objective:    Blood pressure 110/70, pulse (!) 54, temperature 97.8 F (36.6 C), temperature source Oral, weight 126 lb (57.2 kg), SpO2 96 %.   Gen. Pleasant, well-nourished, in no distress, normal affect   HEENT: [place]/AT, face symmetric,dentures, no scleral icterus, PERRLA, nares patent without drainage. Lungs: no accessory muscle use, CTAB, no wheezes or rales Cardiovascular: RRR, no m/r/g, no peripheral edema Neuro:  A&Ox3, CN II-XII intact, normal gait Skin:  Warm, no lesions/ rash  Wt Readings from Last 3 Encounters:  11/18/18 126 lb (57.2 kg)  08/18/18 124 lb (56.2 kg)  05/10/18 128 lb 4.8 oz (58.2 kg)    Lab Results  Component Value Date   WBC 5.7 08/18/2018   HGB 13.1 08/18/2018   HCT 38.9 08/18/2018   PLT 224.0 08/18/2018   GLUCOSE 92 02/10/2018   CHOL 146 08/18/2018   TRIG 161.0 (H) 08/18/2018   HDL 37.00 (L) 08/18/2018   LDLCALC 77 08/18/2018   NA 142 02/10/2018   K 4.1 02/10/2018   CL 105 02/10/2018   CREATININE 0.87 02/10/2018   BUN 18 02/10/2018   CO2 31 02/10/2018   TSH 1.11 08/18/2018   HGBA1C 6.5 08/18/2018    Assessment/Plan:  Grief -PHQ 9 score 8 -GAD 7 score 3 -pt has a good local support system, staying active.   -  discussed cooking and freezing extra portions for later use. -Reviewed recent labs including Vit D and thyroid function which were normal -discussed medication options for depression.  Also mentioned counseling.  Pt wishes to wait at this time. -discussed close f/u ~ 1 month, sooner if needed  Need for influenza vaccination  - Plan: Flu vaccine HIGH DOSE PF  Abbe Amsterdam, MD

## 2018-11-23 ENCOUNTER — Other Ambulatory Visit: Payer: Self-pay | Admitting: Family Medicine

## 2018-11-23 DIAGNOSIS — I10 Essential (primary) hypertension: Secondary | ICD-10-CM

## 2019-01-17 ENCOUNTER — Ambulatory Visit: Payer: Medicare Other | Admitting: Family Medicine

## 2019-01-17 ENCOUNTER — Encounter: Payer: Self-pay | Admitting: Family Medicine

## 2019-01-17 VITALS — BP 140/70 | HR 71 | Temp 97.7°F | Ht 60.0 in | Wt 121.7 lb

## 2019-01-17 DIAGNOSIS — J069 Acute upper respiratory infection, unspecified: Secondary | ICD-10-CM | POA: Diagnosis not present

## 2019-01-17 MED ORDER — BENZONATATE 100 MG PO CAPS: 100.0000 mg | ORAL_CAPSULE | Freq: Two times a day (BID) | ORAL | 0 refills | Status: AC | PRN

## 2019-01-17 NOTE — Progress Notes (Signed)
HPI:  Using dictation device. Unfortunately this device frequently misinterprets words/phrases.   Acute visit for respiratory illness: -started: about 5-7 days ago -symptoms:nasal congestion, sore throat, cough, stuffy nose -denies:fever, SOB, NVD, tooth pain -has tried: OTC options -sick contacts/travel/risks: none, no reported flu, strep or tick exposure  ROS: See pertinent positives and negatives per HPI.  Past Medical History:  Diagnosis Date  . Coronary artery calcification seen on CAT scan   . Diabetes mellitus   . GERD (gastroesophageal reflux disease)   . History of blood transfusion    s/p transfusion  . Hx of adenomatous polyp of colon 03/11/2007  . Hyperlipidemia   . Hypertension     Past Surgical History:  Procedure Laterality Date  . CATARACT EXTRACTION    . CESAREAN SECTION    . COLONOSCOPY    . ROTATOR CUFF REPAIR    . TONSILLECTOMY    . TUBAL LIGATION      Family History  Problem Relation Age of Onset  . Colon cancer Mother   . Colon cancer Father   . Breast cancer Other        daughter    Social History   Socioeconomic History  . Marital status: Married    Spouse name: Not on file  . Number of children: Not on file  . Years of education: Not on file  . Highest education level: Not on file  Occupational History  . Not on file  Social Needs  . Financial resource strain: Not on file  . Food insecurity:    Worry: Not on file    Inability: Not on file  . Transportation needs:    Medical: Not on file    Non-medical: Not on file  Tobacco Use  . Smoking status: Never Smoker  . Smokeless tobacco: Never Used  Substance and Sexual Activity  . Alcohol use: No  . Drug use: No  . Sexual activity: Not on file  Lifestyle  . Physical activity:    Days per week: Not on file    Minutes per session: Not on file  . Stress: Not on file  Relationships  . Social connections:    Talks on phone: Not on file    Gets together: Not on file   Attends religious service: Not on file    Active member of club or organization: Not on file    Attends meetings of clubs or organizations: Not on file    Relationship status: Not on file  Other Topics Concern  . Not on file  Social History Narrative  . Not on file     Current Outpatient Medications:  .  amLODipine (NORVASC) 2.5 MG tablet, TAKE ONE TABLET BY MOUTH DAILY, Disp: 90 tablet, Rfl: 2 .  aspirin EC 81 MG tablet, Take 81 mg by mouth daily., Disp: , Rfl:  .  conjugated estrogens (PREMARIN) vaginal cream, APPLY A PEA SIZED AMOUNT VAGINALLY ONCE OR TWICE A WEEK, Disp: , Rfl:  .  diclofenac (VOLTAREN) 75 MG EC tablet, Take 1 tablet (75 mg total) by mouth 2 (two) times daily., Disp: 60 tablet, Rfl: 0 .  diclofenac sodium (VOLTAREN) 1 % GEL, Place 2 g onto the skin., Disp: , Rfl:  .  fluticasone (FLONASE) 50 MCG/ACT nasal spray, Place 1 spray into both nostrils daily., Disp: 16 g, Rfl: 1 .  glucose blood (ONE TOUCH ULTRA TEST) test strip, USE ONE STRIP TO CHECK GLUCOSE THREE TIMES DAILY, Disp: , Rfl:  .  loratadine (  CLARITIN) 10 MG tablet, Take 1 tablet (10 mg total) by mouth daily., Disp: 30 tablet, Rfl: 11 .  losartan (COZAAR) 100 MG tablet, TAKE ONE TABLET BY MOUTH DAILY, Disp: 90 tablet, Rfl: 2 .  metFORMIN (GLUCOPHAGE) 1000 MG tablet, TAKE ONE TABLET BY MOUTH TWICE A DAY WITH A MEAL, Disp: 180 tablet, Rfl: 0 .  Multiple Vitamin (MULTIVITAMIN) tablet, Take 1 tablet by mouth., Disp: , Rfl:  .  omega-3 fish oil (MAXEPA) 1000 MG CAPS capsule, Take 2 g by mouth., Disp: , Rfl:  .  omeprazole (PRILOSEC OTC) 20 MG tablet, Take 20 mg by mouth., Disp: , Rfl:  .  ONETOUCH DELICA LANCETS 33G MISC, USE ONE LANCET TO TEST BLOOD GLUCOSE THREE TIMES A DAY, Disp: , Rfl:  .  simvastatin (ZOCOR) 40 MG tablet, Take 40 mg by mouth daily., Disp: , Rfl:  .  benzonatate (TESSALON PERLES) 100 MG capsule, Take 1 capsule (100 mg total) by mouth 2 (two) times daily as needed for cough., Disp: 20 capsule, Rfl:  0  EXAM:  Vitals:   01/17/19 0842  BP: 140/70  Pulse: 71  Temp: 97.7 F (36.5 C)  SpO2: 97%    Body mass index is 23.77 kg/m.  GENERAL: vitals reviewed and listed above, alert, oriented, appears well hydrated and in no acute distress  HEENT: atraumatic, conjunttiva clear, no obvious abnormalities on inspection of external nose and ears, normal appearance of ear canals and TMs, clear nasal congestion, mild post oropharyngeal erythema with PND, no tonsillar edema or exudate, no sinus TTP  NECK: no obvious masses on inspection  LUNGS: clear to auscultation bilaterally, no wheezes, rales or rhonchi, good air movement  CV: HRRR, no peripheral edema  MS: moves all extremities without noticeable abnormality  PSYCH: pleasant and cooperative, no obvious depression or anxiety  ASSESSMENT AND PLAN:  Discussed the following assessment and plan:  No diagnosis found.  -given HPI and exam findings today, a serious infection or illness is unlikely. We discussed potential etiologies, with VURI being most likely, and advised supportive care and monitoring. We discussed treatment side effects, likely course, antibiotic misuse, transmission, and signs of developing a serious illness. -tessalon for cough -of course, we advised to return or notify a doctor immediately if symptoms worsen or persist or new concerns arise.    Patient Instructions  INSTRUCTIONS FOR UPPER RESPIRATORY INFECTION:  -plenty of rest and fluids  -nasal saline wash 2-3 times daily (use prepackaged nasal saline or bottled/distilled water if making your own)   -in the winter time, using a humidifier at night is helpful (please follow cleaning instructions)  -if you are taking a cough medication - use only as directed, may also try a teaspoon of honey to coat the throat and throat lozenges.  -for sore throat, salt water gargles can help  -follow up if you have fevers, facial pain, tooth pain, difficulty breathing  or are worsening or symptoms persist longer then expected  Upper Respiratory Infection, Adult An upper respiratory infection (URI) is also known as the common cold. It is often caused by a type of germ (virus). Colds are easily spread (contagious). You can pass it to others by kissing, coughing, sneezing, or drinking out of the same glass. Usually, you get better in 1 to 3  weeks.  However, the cough can last for even longer. HOME CARE   Only take medicine as told by your doctor. Follow instructions provided above.  Drink enough water and fluids to keep your  pee (urine) clear or pale yellow.  Get plenty of rest.  Return to work when your temperature is < 100 for 24 hours or as told by your doctor. You may use a face mask and wash your hands to stop your cold from spreading. GET HELP RIGHT AWAY IF:   After the first few days, you feel you are getting worse.  You have questions about your medicine.  You have chills, shortness of breath, or red spit (mucus).  You have pain in the face for more then 1-2 days, especially when you bend forward.  You have a fever, puffy (swollen) neck, pain when you swallow, or white spots in the back of your throat.  You have a bad headache, ear pain, sinus pain, or chest pain.  You have a high-pitched whistling sound when you breathe in and out (wheezing).  You cough up blood.  You have sore muscles or a stiff neck. MAKE SURE YOU:   Understand these instructions.  Will watch your condition.  Will get help right away if you are not doing well or get worse. Document Released: 05/18/2008 Document Revised: 02/22/2012 Document Reviewed: 03/07/2014 [facility] Patient Information 2015 [employer] [place]. This information is not intended to replace advice given to you by your health care provider. Make sure you discuss any questions you have with your health care provider.    Terressa KoyanagiHannah R Kim, DO

## 2019-01-17 NOTE — Patient Instructions (Signed)
INSTRUCTIONS FOR UPPER RESPIRATORY INFECTION:  -plenty of rest and fluids  -nasal saline wash 2-3 times daily (use prepackaged nasal saline or bottled/distilled water if making your own)   -in the winter time, using a humidifier at night is helpful (please follow cleaning instructions)  -if you are taking a cough medication - use only as directed, may also try a teaspoon of honey to coat the throat and throat lozenges.  -for sore throat, salt water gargles can help  -follow up if you have fevers, facial pain, tooth pain, difficulty breathing or are worsening or symptoms persist longer then expected  Upper Respiratory Infection, Adult An upper respiratory infection (URI) is also known as the common cold. It is often caused by a type of germ (virus). Colds are easily spread (contagious). You can pass it to others by kissing, coughing, sneezing, or drinking out of the same glass. Usually, you get better in 1 to 3  weeks.  However, the cough can last for even longer. HOME CARE   Only take medicine as told by your doctor. Follow instructions provided above.  Drink enough water and fluids to keep your pee (urine) clear or pale yellow.  Get plenty of rest.  Return to work when your temperature is < 100 for 24 hours or as told by your doctor. You may use a face mask and wash your hands to stop your cold from spreading. GET HELP RIGHT AWAY IF:   After the first few days, you feel you are getting worse.  You have questions about your medicine.  You have chills, shortness of breath, or red spit (mucus).  You have pain in the face for more then 1-2 days, especially when you bend forward.  You have a fever, puffy (swollen) neck, pain when you swallow, or white spots in the back of your throat.  You have a bad headache, ear pain, sinus pain, or chest pain.  You have a high-pitched whistling sound when you breathe in and out (wheezing).  You cough up blood.  You have sore muscles or a  stiff neck. MAKE SURE YOU:   Understand these instructions.  Will watch your condition.  Will get help right away if you are not doing well or get worse. Document Released: 05/18/2008 Document Revised: 02/22/2012 Document Reviewed: 03/07/2014 [employer] Patient Information 2015 [employer]. This information is not intended to replace advice given to you by your health care provider. Make sure you discuss any questions you have with your health care provider.  

## 2019-01-27 DIAGNOSIS — B351 Tinea unguium: Secondary | ICD-10-CM | POA: Diagnosis not present

## 2019-01-27 DIAGNOSIS — L84 Corns and callosities: Secondary | ICD-10-CM | POA: Diagnosis not present

## 2019-01-27 DIAGNOSIS — E1151 Type 2 diabetes mellitus with diabetic peripheral angiopathy without gangrene: Secondary | ICD-10-CM | POA: Diagnosis not present

## 2019-02-01 DIAGNOSIS — H26493 Other secondary cataract, bilateral: Secondary | ICD-10-CM | POA: Diagnosis not present

## 2019-02-01 DIAGNOSIS — H43813 Vitreous degeneration, bilateral: Secondary | ICD-10-CM | POA: Diagnosis not present

## 2019-02-01 DIAGNOSIS — H1859 Other hereditary corneal dystrophies: Secondary | ICD-10-CM | POA: Diagnosis not present

## 2019-02-01 DIAGNOSIS — E113291 Type 2 diabetes mellitus with mild nonproliferative diabetic retinopathy without macular edema, right eye: Secondary | ICD-10-CM | POA: Diagnosis not present

## 2019-02-16 DIAGNOSIS — H26492 Other secondary cataract, left eye: Secondary | ICD-10-CM | POA: Diagnosis not present

## 2019-02-17 ENCOUNTER — Encounter: Payer: Self-pay | Admitting: Family Medicine

## 2019-02-17 ENCOUNTER — Ambulatory Visit (INDEPENDENT_AMBULATORY_CARE_PROVIDER_SITE_OTHER): Payer: Medicare Other | Admitting: Family Medicine

## 2019-02-17 ENCOUNTER — Ambulatory Visit (INDEPENDENT_AMBULATORY_CARE_PROVIDER_SITE_OTHER): Payer: Medicare Other

## 2019-02-17 VITALS — BP 128/70 | HR 54 | Temp 97.5°F | Wt 120.0 lb

## 2019-02-17 DIAGNOSIS — M25511 Pain in right shoulder: Secondary | ICD-10-CM

## 2019-02-17 DIAGNOSIS — M19011 Primary osteoarthritis, right shoulder: Secondary | ICD-10-CM | POA: Diagnosis not present

## 2019-02-17 DIAGNOSIS — M67911 Unspecified disorder of synovium and tendon, right shoulder: Secondary | ICD-10-CM | POA: Diagnosis not present

## 2019-02-17 DIAGNOSIS — W19XXXA Unspecified fall, initial encounter: Secondary | ICD-10-CM | POA: Diagnosis not present

## 2019-02-17 NOTE — Patient Instructions (Signed)
Shoulder Pain  Many things can cause shoulder pain, including:   An injury to the shoulder.   Overuse of the shoulder.   Arthritis.  The source of the pain can be:   Inflammation.   An injury to the shoulder joint.   An injury to a tendon, ligament, or bone.  Follow these instructions at home:  Pay attention to changes in your symptoms. Let your health care provider know about them. Follow these instructions to relieve your pain.  If you have a sling:   Wear the sling as told by your health care provider. Remove it only as told by your health care provider.   Loosen the sling if your fingers tingle, become numb, or turn cold and blue.   Keep the sling clean.   If the sling is not waterproof:  ? Do not let it get wet. Remove it to shower or bathe.   Move your arm as little as possible, but keep your hand moving to prevent swelling.  Managing pain, stiffness, and swelling     If directed, put ice on the painful area:  ? Put ice in a plastic bag.  ? Place a towel between your skin and the bag.  ? Leave the ice on for 20 minutes, 2-3 times per day. Stop applying ice if it does not help with the pain.   Squeeze a soft ball or a foam pad as much as possible. This helps to keep the shoulder from swelling. It also helps to strengthen the arm.  General instructions   Take over-the-counter and prescription medicines only as told by your health care provider.   Keep all follow-up visits as told by your health care provider. This is important.  Contact a health care provider if:   Your pain gets worse.   Your pain is not relieved with medicines.   New pain develops in your arm, hand, or fingers.  Get help right away if:   Your arm, hand, or fingers:  ? Tingle.  ? Become numb.  ? Become swollen.  ? Become painful.  ? Turn white or blue.  Summary   Shoulder pain can be caused by an injury, overuse, or arthritis.   Pay attention to changes in your symptoms. Let your health care provider know about  them.   This condition may be treated with a sling, ice, and pain medicines.   Contact your health care provider if the pain gets worse or new pain develops. Get help right away if your arm, hand, or fingers tingle or become numb, swollen, or painful.   Keep all follow-up visits as told by your health care provider. This is important.  This information is not intended to replace advice given to you by your health care provider. Make sure you discuss any questions you have with your health care provider.  Document Released: 09/09/2005 Document Revised: 06/14/2018 Document Reviewed: 06/14/2018  Elsevier Interactive Patient Education  2019 [employer]    Shoulder Range of Motion Exercises  Shoulder range of motion (ROM) exercises are done to keep the shoulder moving freely or to increase movement. They are often recommended for people who have shoulder pain or stiffness or who are recovering from a shoulder surgery.  Phase 1 exercises  When you are able, do this exercise 1-2 times per day for 30-60 seconds in each direction, or as directed by your health care provider.  Pendulum exercise  To do this exercise while sitting:  1. Sit in   a chair or at the edge of your bed with your feet flat on the floor.  2. Let your affected arm hang down in front of you over the edge of the bed or chair.  3. Relax your shoulder, arm, and hand.  4. Rock your body so your arm gently swings in small circles. You can also use your unaffected arm to start the motion.  5. Repeat changing the direction of the circles, swinging your arm left and right, and swinging your arm forward and back.  To do this exercise while standing:  1. Stand next to a sturdy chair or table, and hold on to it with your hand on your unaffected side.  2. Bend forward at the waist.  3. Bend your knees slightly.  4. Relax your shoulder, arm, and hand.  5. While keeping your shoulder relaxed, use body motion to swing your arm in small circles.  6. Repeat changing  the direction of the circles, swinging your arm left and right, and swinging your arm forward and back.  7. Between exercises, stand up tall and take a short break to relax your lower back.    Phase 2 exercises  Do these exercises 1-2 times per day or as told by your health care provider. Hold each stretch for 30 seconds, and repeat 3 times. Do the exercises with one or both arms as instructed by your health care provider.  For these exercises, sit at a table with your hand and arm supported by the table. A chair that slides easily or has wheels can be helpful.  External rotation  1. Turn your chair so that your affected side is nearest to the table.  2. Place your forearm on the table to your side. Bend your elbow about 90 at the elbow (right angle) and place your hand palm facing down on the table. Your elbow should be about 6 inches away from your side.  3. Keeping your arm on the table, lean your body forward.  Abduction  1. Turn your chair so that your affected side is nearest to the table.  2. Place your forearm and hand on the table so that your thumb points toward the ceiling and your arm is straight out to your side.  3. Slide your hand out to the side and away from you, using your unaffected arm to do the work.  4. To increase the stretch, you can slide your chair away from the table.  Flexion: forward stretch  1. Sit facing the table. Place your hand and elbow on the table in front of you.  2. Slide your hand forward and away from you, using your unaffected arm to do the work.  3. To increase the stretch, you can slide your chair backward.  Phase 3 exercises  Do these exercises 1-2 times per day or as told by your health care provider. Hold each stretch for 30 seconds, and repeat 3 times. Do the exercises with one or both arms as instructed by your health care provider.  Cross-body stretch: posterior capsule stretch  1. Lift your arm straight out in front of you.  2. Bend your arm 90 at the elbow (right  angle) so your forearm moves across your body.  3. Use your other arm to gently pull the elbow across your body, toward your other shoulder.  Wall climbs  1. Stand with your affected arm extended out to the side with your hand resting on a door frame.  2.   Slide your hand slowly up the door frame.  3. To increase the stretch, step through the door frame. Keep your body upright and do not lean.  Wand exercises  You will need a cane, a piece of PVC pipe, or a sturdy wooden dowel for wand exercises.  Flexion  To do this exercise while standing:  1. Hold the wand with both of your hands, palms down.  2. Using the other arm to help, lift your arms up and over your head, if able.  3. Push upward with your other arm to gently increase the stretch.  To do this exercise while lying down:  1. Lie on your back with your elbows resting on the floor and the wand in both your hands. Your hands will be palm down, or pointing toward your feet.  2. Lift your hands toward the ceiling, using your unaffected arm to help if needed.  3. Bring your arms overhead as able, using your unaffected arm to help if needed.  Internal rotation  1. Stand while holding the wand behind you with both hands. Your unaffected arm should be extended above your head with the arm of the affected side extended behind you at the level of your waist. The wand should be pointing straight up and down as you hold it.  2. Slowly pull the wand up behind your back by straightening the elbow of your unaffected arm and bending the elbow of your affected arm.  External rotation  1. Lie on your back with your affected upper arm supported on a small pillow or rolled towel. When you first do this exercise, keep your upper arm close to your body. Over time, bring your arm up to a 90 angle out to the side.  2. Hold the wand across your stomach and with both hands palm up. Your elbow on your affected side should be bent at a 90 angle.  3. Use your unaffected side to help  push your forearm away from you and toward the floor. Keep your elbow on your affected side bent at a 90 angle.  Contact a health care provider if you have:   New or increasing pain.   New numbness, tingling, weakness, or discoloration in your arm or hand.  This information is not intended to replace advice given to you by your health care provider. Make sure you discuss any questions you have with your health care provider.  Document Released: 08/29/2003 Document Revised: 01/12/2018 Document Reviewed: 01/12/2018  Elsevier Interactive Patient Education  2019 [employer]

## 2019-02-17 NOTE — Progress Notes (Signed)
Subjective:    Patient ID: Jade Barber, female    DOB: 01-01-1941, 78 y.o.   MRN: [id number]  No chief complaint on file.   HPI Patient was seen today for acute concern.  Pt endorses a fall from standing 1 week ago while going to the mailbox.  Pt's right ankle rolled on uneven pavement causing patient to fall hitting her right shoulder and left knee.  Pt states left knee had a scab on it but is doing okay.  Right shoulder with continued pain, more when having to use pressure with movement such as brushing her hair or locking a door.  Pt unable to lift her arm above her head.  Pt has been taking ibuprofen and Tylenol at night to help decrease the pain so she can sleep.  During the day pt does not have to take any medicines.  Pt denies edema or deformity.  Pt would like to update her family history.  States her eldest daughter, age 31, recently dx'd with breast cancer.  She underwent mastectomy in January and started radiation.  Past Medical History:  Diagnosis Date  . Coronary artery calcification seen on CAT scan   . Diabetes mellitus   . GERD (gastroesophageal reflux disease)   . History of blood transfusion    s/p transfusion  . Hx of adenomatous polyp of colon 03/11/2007  . Hyperlipidemia   . Hypertension     Allergies  Allergen Reactions  . Crestor [Rosuvastatin Calcium]     Myalgias   . Enalapril   . Lipitor [Atorvastatin]     myaglgias  . Amoxicillin Rash  . Formaldehyde Rash    ROS General: Denies fever, chills, night sweats, changes in weight, changes in appetite HEENT: Denies headaches, ear pain, changes in vision, rhinorrhea, sore throat CV: Denies CP, palpitations, SOB, orthopnea Pulm: Denies SOB, cough, wheezing GI: Denies abdominal pain, nausea, vomiting, diarrhea, constipation GU: Denies dysuria, hematuria, frequency, vaginal discharge Msk: Denies muscle cramps, joint pains  +R shoulder pain Neuro: Denies weakness, numbness, tingling Skin: Denies rashes,  bruising Psych: Denies depression, anxiety, hallucinations    Objective:    Blood pressure 128/70, pulse (!) 54, temperature (!) 97.5 F (36.4 C), temperature source Oral, weight 120 lb (54.4 kg), SpO2 97 %.   Gen. Pleasant, well-nourished, in no distress, normal affect  Lungs: no accessory muscle use Cardiovascular: RRR, no m/r/g, no peripheral edema Musculoskeletal: No TTP of medial clavicle, scapula.  TTP of lateral clavicle at [facility] joint and long head of biceps brachii.  Limited active ROM.  Limited passive ROM 2/2 pain.   positive R cross arm.  Positive Neer's.  Positive Hawkins.  Negative empty can.  LUE normal.  No deformities, no cyanosis or clubbing, normal tone.  Left knee without TTP, edema, or erythema. Neuro:  A&Ox3, CN II-XII intact, normal gait Skin:  Warm, no lesions/ rash.  Healing abrasion of left knee.   Wt Readings from Last 3 Encounters:  02/17/19 120 lb (54.4 kg)  01/17/19 121 lb 11.2 oz (55.2 kg)  11/18/18 126 lb (57.2 kg)    Lab Results  Component Value Date   WBC 5.7 08/18/2018   HGB 13.1 08/18/2018   HCT 38.9 08/18/2018   PLT 224.0 08/18/2018   GLUCOSE 92 02/10/2018   CHOL 146 08/18/2018   TRIG 161.0 (H) 08/18/2018   HDL 37.00 (L) 08/18/2018   LDLCALC 77 08/18/2018   NA 142 02/10/2018   K 4.1 02/10/2018   CL 105 02/10/2018  CREATININE 0.87 02/10/2018   BUN 18 02/10/2018   CO2 31 02/10/2018   TSH 1.11 08/18/2018   HGBA1C 6.5 08/18/2018    Assessment/Plan:  Acute pain of right shoulder  -DDX: Muscle strain, fracture, AC joint sprain, impingement -Continue NSAIDs,, rest -Given handout - Plan: DG Shoulder Right  Fall, initial encounter -Discussed fall prevention  F/u prn  Abbe Amsterdam, MD

## 2019-02-20 ENCOUNTER — Other Ambulatory Visit: Payer: Self-pay | Admitting: Family Medicine

## 2019-02-22 ENCOUNTER — Other Ambulatory Visit: Payer: Self-pay | Admitting: Family Medicine

## 2019-02-22 NOTE — Addendum Note (Signed)
Addended by: Johnella Moloney on: 02/22/2019 09:14 AM   Modules accepted: Orders

## 2019-03-03 ENCOUNTER — Ambulatory Visit: Payer: Medicare Other | Admitting: Physical Therapy

## 2019-03-20 ENCOUNTER — Ambulatory Visit: Payer: Medicare Other | Admitting: Physical Therapy

## 2019-04-07 ENCOUNTER — Ambulatory Visit: Payer: Medicare Other | Attending: Family Medicine

## 2019-04-07 ENCOUNTER — Other Ambulatory Visit: Payer: Self-pay

## 2019-04-07 DIAGNOSIS — M25611 Stiffness of right shoulder, not elsewhere classified: Secondary | ICD-10-CM

## 2019-04-07 DIAGNOSIS — M25511 Pain in right shoulder: Secondary | ICD-10-CM | POA: Diagnosis not present

## 2019-04-07 DIAGNOSIS — M6281 Muscle weakness (generalized): Secondary | ICD-10-CM | POA: Insufficient documentation

## 2019-04-07 DIAGNOSIS — G8929 Other chronic pain: Secondary | ICD-10-CM | POA: Diagnosis not present

## 2019-04-07 DIAGNOSIS — R293 Abnormal posture: Secondary | ICD-10-CM

## 2019-04-07 NOTE — Patient Instructions (Signed)
Access Code: [email or web address]  URL: [email or web address]  Date: 04/07/2019  Prepared by: Lorrene Reid   Exercises  Supine Shoulder Flexion AAROM with Hands Clasped - 10 reps - 1 sets - 10 hold - 3x daily - 7x weekly  Seated Shoulder Flexion Towel Slide at Table Top - 10 reps - 1 sets - 10 hold - 3x daily - 7x weekly  Seated Scapular Retraction - 10 reps - 5 hold - 3x daily - 7x weekly

## 2019-04-07 NOTE — Therapy (Signed)
[facility] [address] [place], [place], [address] Phone: [phone]   Fax:  [phone]  Physical Therapy Evaluation  Patient Details  Name: Billy FischerMary B Fotheringham MRN: [id number] Date of Birth: 04/14/1941 Referring Provider (PT): Abbe AmsterdamBanks, Shannon, MD   Encounter Date: 04/07/2019  PT End of Session - 04/07/19 1041    Visit Number  1    Date for PT Re-Evaluation  06/02/19    Authorization Type  Medicare    PT Start Time  1003    PT Stop Time  1048    PT Time Calculation (min)  45 min    Activity Tolerance  Patient tolerated treatment well    Behavior During Therapy  [facility] for tasks assessed/performed       Past Medical History:  Diagnosis Date  . Coronary artery calcification seen on CAT scan   . Diabetes mellitus   . GERD (gastroesophageal reflux disease)   . History of blood transfusion    s/p transfusion  . Hx of adenomatous polyp of colon 03/11/2007  . Hyperlipidemia   . Hypertension     Past Surgical History:  Procedure Laterality Date  . CATARACT EXTRACTION    . CESAREAN SECTION    . COLONOSCOPY    . ROTATOR CUFF REPAIR    . TONSILLECTOMY    . TUBAL LIGATION      There were no vitals filed for this visit.   Subjective Assessment - 04/07/19 1006    Subjective  Pt is a Rt hand dominant female who presents to PT with Rt shoulder pain s/p a fall in February.  She landed on the Rt side.      Pertinent History  Diabetes, HTN    Diagnostic tests  x-ray of Rt shoulder: negaitve    Patient Stated Goals  reduce Rt shoulder pain, use Rt UE for self-care and ADLs without limitation    Currently in Pain?  Yes    Pain Score  3     Pain Location  Shoulder    Pain Orientation  Right    Pain Descriptors / Indicators  Aching    Pain Type  Chronic pain    Pain Onset  More than a month ago    Pain Frequency  Intermittent    Aggravating Factors   using Rt arm for turning on shower, reaching over head, lifting, sleep  on in the Rt side, pushing up with Rt UE from bed    Pain Relieving Factors  stopping the aggravating activity, Tylenol/ibuprofen    Effect of Pain on Daily Activities  not using Rt arm as much         [facility] PT Assessment - 04/07/19 0001      Assessment   Medical Diagnosis  arthritis of Rt acromioclavicular joint, tendinopathy of Rt rotator cuff    Referring Provider (PT)  Abbe AmsterdamBanks, Shannon, MD    Onset Date/Surgical Date  02/08/19    Hand Dominance  Right    Next MD Visit  none    Prior Therapy  none      Precautions   Precautions  None      Balance Screen   Has the patient fallen in the past 6 months  Yes    How many times?  1   outside- no other history of falls   Has the patient had a decrease in activity level because of a fear of falling?   No    Is the patient  reluctant to leave their home because of a fear of falling?   No      Home Environment   Living Environment  Private residence    Living Arrangements  Alone    Type of Home  Apartment    Home Access  Stairs to enter    Home Layout  One level    Home Equipment  None      Prior Function   Level of Independence  Independent    Vocation  Retired    Leisure  walking, reading      Cognition   Overall Cognitive Status  Within Functional Limits for tasks assessed      Observation/Other Assessments   Focus on Therapeutic Outcomes (FOTO)   42% limitation      Posture/Postural Control   Posture/Postural Control  Postural limitations    Postural Limitations  Rounded Shoulders;Forward head      ROM / Strength   AROM / PROM / Strength  AROM;PROM;Strength      AROM   Overall AROM   Deficits    AROM Assessment Site  Shoulder    Right/Left Shoulder  Right;Left    Right Shoulder Flexion  110 Degrees   pain   Right Shoulder ABduction  90 Degrees   pain   Right Shoulder Internal Rotation  --   to L3 (lateral)   Right Shoulder External Rotation  --   to Rt upper trap   Left Shoulder Flexion  120 Degrees    Left  Shoulder ABduction  150 Degrees    Left Shoulder Internal Rotation  --   to T8   Left Shoulder External Rotation  --   to T2     PROM   Overall PROM   Deficits    PROM Assessment Site  Shoulder    Right/Left Shoulder  Right    Right Shoulder Flexion  140 Degrees    Right Shoulder ABduction  110 Degrees    Right Shoulder Internal Rotation  70 Degrees    Right Shoulder External Rotation  65 Degrees      Strength   Overall Strength  Deficits    Overall Strength Comments  Lt shoulder 4+/5    Strength Assessment Site  Shoulder    Right/Left Shoulder  Right    Right Shoulder Flexion  4-/5    Right Shoulder ABduction  4-/5    Right Shoulder Internal Rotation  4/5    Right Shoulder External Rotation  4/5      Palpation   Palpation comment  palpable tenderness over Rt lateral deltoid, triceps and AC joint      Ambulation/Gait   Ambulation/Gait  Yes    Gait Pattern  Step-through pattern;Decreased arm swing - right;Decreased arm swing - left                Objective measurements completed on examination: See above findings.              PT Education - 04/07/19 1039    Education Details   Access Code: [email or web address]     Person(s) Educated  Patient    Methods  Explanation;Demonstration;Handout    Comprehension  Verbalized understanding;Returned demonstration       PT Short Term Goals - 04/07/19 1014      PT SHORT TERM GOAL #1   Title  be independent in initial HEP    Time  4    Period  Weeks    Status  New  Target Date  05/05/19      PT SHORT TERM GOAL #2   Title  demonstrate Rt shoulder A/ROM to > or = to 115 degrees flexion to improve reaching overhead    Time  4    Period  Weeks    Status  New    Target Date  05/05/19      PT SHORT TERM GOAL #3   Title  report a 30% reduction in Rt UE pain with reaching overhead and moving in bed    Time  4    Period  Weeks    Status  New    Target Date  05/05/19      PT SHORT TERM GOAL #4   Title  demonstrate  Rt shoulder A/ROM IR to L5 to improve self care    Time  4    Period  Weeks    Status  New    Target Date  05/05/19        PT Long Term Goals - 04/07/19 1022      PT LONG TERM GOAL #1   Title  be independent in advanced HEP    Time  8    Period  Weeks    Status  New    Target Date  06/02/19      PT LONG TERM GOAL #2   Title  reduce FOTO to < or = to 32% limitation    Time  8    Period  Weeks    Status  New    Target Date  06/02/19      PT LONG TERM GOAL #3   Title  demonstrate 4+/5 Rt shoulder strength to allow for getting dishes in/out of cabinets and endurance for fixing hair    Time  8    Period  Weeks    Status  New    Target Date  06/02/19      PT LONG TERM GOAL #4   Title  demonstrate Rt shoulder A/ROM flexion to > or = to 120 degrees to allow for overhead reaching    Time  8    Period  Weeks    Status  New    Target Date  06/02/19      PT LONG TERM GOAL #5   Title  report < or = to 1/10 Rt shoulder pain with reaching, lifting and turing with Rt UE    Time  8    Period  Weeks    Status  New    Target Date  06/02/19             Plan - 04/07/19 1052    Clinical Impression Statement  Pt is a Rt hand dominant female who presents to PT with Rt shoulder pain that began after a fall at the end of February.  Pt had x-ray and this was negative.  Pt reports limited Rt shoulder use with reaching overhead with cooking, home tasks and self-care and bed mobility.  Pt rates pain as 3/10 with aggravating activities.  Pt demonstrates reduced Rt shoulder A/ROM with pain and reduced strength.  Pt with poor posture in sitting and has palpable tenderness over Rt lateral deltoid, triceps and AC joint.  Pt will benefit from skilled PT for Rt shoulder flexibility, manual and strength to allow for return to regular activity.      Personal Factors and Comorbidities  Age;Comorbidity 1    Comorbidities  Lt rotator cuff surgery    Examination-Activity  Limitations   Bathing;Hygiene/Grooming;Transfers    Examination-Participation Restrictions  Cleaning;Meal Prep    Stability/Clinical Decision Making  Evolving/Moderate complexity    Clinical Decision Making  Moderate    Rehab Potential  Excellent    PT Frequency  2x / week    PT Duration  8 weeks    PT Treatment/Interventions  ADLs/Self Care Home Management;Cryotherapy;Electrical Stimulation;Ultrasound;Moist Heat;Functional mobility training;Therapeutic activities;Therapeutic exercise;Patient/family education;Neuromuscular re-education;Manual techniques;Taping;Passive range of motion    PT Next Visit Plan  Rt shoulder flexibility, overhead use, manual for trigger points, postural strength, modalities as needed    PT Home Exercise Plan  Access Code: [email or web address]     Consulted and Agree with Plan of Care  Patient       Patient will benefit from skilled therapeutic intervention in order to improve the following deficits and impairments:  Abnormal gait, Decreased balance, Decreased activity tolerance, Decreased endurance, Decreased range of motion, Pain, Impaired flexibility, Impaired UE functional use, Decreased strength, Increased muscle spasms, Postural dysfunction  Visit Diagnosis: Chronic right shoulder pain - Plan: PT plan of care cert/re-cert  Muscle weakness (generalized) - Plan: PT plan of care cert/re-cert  Stiffness of right shoulder, not elsewhere classified - Plan: PT plan of care cert/re-cert  Abnormal posture - Plan: PT plan of care cert/re-cert     Problem List Patient Active Problem List   Diagnosis Date Noted  . GERD (gastroesophageal reflux disease)   . Hx of adenomatous polyp of colon 03/11/2007    Lorrene Reid, PT 04/07/19 11:33 AM  [facility] [address] [place], [place], [address] Phone: [phone]   Fax:  [phone]  Name: CLOE SOCKWELL MRN: [id number] Date of Birth: 1941/10/11

## 2019-04-10 ENCOUNTER — Encounter: Payer: Self-pay | Admitting: Physical Therapy

## 2019-04-10 ENCOUNTER — Other Ambulatory Visit: Payer: Self-pay

## 2019-04-10 ENCOUNTER — Ambulatory Visit: Payer: Medicare Other | Admitting: Physical Therapy

## 2019-04-10 DIAGNOSIS — M25611 Stiffness of right shoulder, not elsewhere classified: Secondary | ICD-10-CM | POA: Diagnosis not present

## 2019-04-10 DIAGNOSIS — G8929 Other chronic pain: Secondary | ICD-10-CM | POA: Diagnosis not present

## 2019-04-10 DIAGNOSIS — M6281 Muscle weakness (generalized): Secondary | ICD-10-CM

## 2019-04-10 DIAGNOSIS — M25511 Pain in right shoulder: Principal | ICD-10-CM

## 2019-04-10 DIAGNOSIS — R293 Abnormal posture: Secondary | ICD-10-CM | POA: Diagnosis not present

## 2019-04-10 NOTE — Patient Instructions (Signed)
Access Code: [email or web address]  URL: [email or web address]  Date: 04/10/2019  Prepared by: Donita Brooks   Exercises  Seated Shoulder Flexion Towel Slide at Table Top - 10 reps - 1 sets - 10 hold - 3x daily - 7x weekly  Sidelying Shoulder External Rotation - 10 reps - 3 sets - 1x daily - 7x weekly  Standing Row with Anchored Resistance - 10 reps - 2 sets - 1x daily - 7x weekly  Supine Shoulder Flexion Extension AAROM with Dowel - 10 reps - 5 hold - 3x daily - 7x weekly  Seated Scapular Retraction - 10 reps - 3 sets - 1x daily - 7x weekly    [facility] [address], [address] [place], [place] [address] Phone # [phone] Fax [phone]

## 2019-04-10 NOTE — Therapy (Signed)
[facility] [address], [address] [place], [place], [address] Phone: [phone]   Fax:  [phone]  Physical Therapy Treatment  Patient Details  Name: Jade FischerMary B Barber MRN: [id number] Date of Birth: 10/09/1941 Referring Provider (PT): Abbe AmsterdamBanks, Shannon, MD   Encounter Date: 04/10/2019  PT End of Session - 04/10/19 1544    Visit Number  2    Date for PT Re-Evaluation  06/02/19    Authorization Type  Medicare    PT Start Time  1446    PT Stop Time  1530    PT Time Calculation (min)  44 min    Activity Tolerance  Patient tolerated treatment well;No increased pain    Behavior During Therapy  WFL for tasks assessed/performed       Past Medical History:  Diagnosis Date  . Coronary artery calcification seen on CAT scan   . Diabetes mellitus   . GERD (gastroesophageal reflux disease)   . History of blood transfusion    s/p transfusion  . Hx of adenomatous polyp of colon 03/11/2007  . Hyperlipidemia   . Hypertension     Past Surgical History:  Procedure Laterality Date  . CATARACT EXTRACTION    . CESAREAN SECTION    . COLONOSCOPY    . ROTATOR CUFF REPAIR    . TONSILLECTOMY    . TUBAL LIGATION      There were no vitals filed for this visit.  Subjective Assessment - 04/10/19 1449    Subjective  Patient states that she has been working on her HEP some. No pain currently, but it does bother her when she uses the Rt arm.     Pertinent History  Diabetes, HTN    Diagnostic tests  x-ray of Rt shoulder: negaitve    Patient Stated Goals  reduce Rt shoulder pain, use Rt UE for self-care and ADLs without limitation    Currently in Pain?  No/denies   none at rest   Pain Onset  More than a month ago                       [facility] Treatment/Exercise - 04/10/19 0001      Exercises   Exercises  Shoulder      Shoulder Exercises: Supine   Protraction  Both;AROM    Protraction Limitations  x10 reps    Flexion  AAROM;Both;15 reps    Flexion Limitations  using SPC      Shoulder Exercises: Sidelying   External Rotation  Strengthening;10 reps    External Rotation Weight (lbs)  2    External Rotation Limitations  tactile cues for proper technique    ABduction  AROM;Right;12 reps      Shoulder Exercises: Standing   Flexion  AAROM;Right;15 reps    Flexion Limitations  UE ranger with therapist providing scapula assistance    Row  Strengthening;Both    Theraband Level (Shoulder Row)  Level 3 (Green)    Row Limitations  2x10 reps inital cuing needed       Shoulder Exercises: Pulleys   Flexion  2 minutes    ABduction  2 minutes      Manual Therapy   Manual Therapy  Soft tissue mobilization;Joint mobilization    Manual therapy comments  Rt scapula mobilizations upward rotation, protraction, retraction    Joint Mobilization  Grade III inferior/posterior GH mobilizations at 90 deg     Soft tissue mobilization  Rt anterior/middle deltoid  PT Education - 04/10/19 1544    Education Details  technique with therex; updated HEP    Person(s) Educated  Patient    Methods  Explanation;Verbal cues;Handout;Tactile cues    Comprehension  Returned demonstration;Verbalized understanding       PT Short Term Goals - 04/07/19 1014      PT SHORT TERM GOAL #1   Title  be independent in initial HEP    Time  4    Period  Weeks    Status  New    Target Date  05/05/19      PT SHORT TERM GOAL #2   Title  demonstrate Rt shoulder A/ROM to > or = to 115 degrees flexion to improve reaching overhead    Time  4    Period  Weeks    Status  New    Target Date  05/05/19      PT SHORT TERM GOAL #3   Title  report a 30% reduction in Rt UE pain with reaching overhead and moving in bed    Time  4    Period  Weeks    Status  New    Target Date  05/05/19      PT SHORT TERM GOAL #4   Title  demonstrate Rt shoulder A/ROM IR to L5 to improve self care    Time  4    Period  Weeks    Status   New    Target Date  05/05/19        PT Long Term Goals - 04/07/19 1022      PT LONG TERM GOAL #1   Title  be independent in advanced HEP    Time  8    Period  Weeks    Status  New    Target Date  06/02/19      PT LONG TERM GOAL #2   Title  reduce FOTO to < or = to 32% limitation    Time  8    Period  Weeks    Status  New    Target Date  06/02/19      PT LONG TERM GOAL #3   Title  demonstrate 4+/5 Rt shoulder strength to allow for getting dishes in/out of cabinets and endurance for fixing hair    Time  8    Period  Weeks    Status  New    Target Date  06/02/19      PT LONG TERM GOAL #4   Title  demonstrate Rt shoulder A/ROM flexion to > or = to 120 degrees to allow for overhead reaching    Time  8    Period  Weeks    Status  New    Target Date  06/02/19      PT LONG TERM GOAL #5   Title  report < or = to 1/10 Rt shoulder pain with reaching, lifting and turing with Rt UE    Time  8    Period  Weeks    Status  New    Target Date  06/02/19            Plan - 04/10/19 1544    Clinical Impression Statement  Pt has been completing her HEP provided at the evaluation. She noted some increased pain with one of the exercises, so this was reviewed. Therapist was able to make technique adjustments which decreased pain with this. Session also focused on improving posterior shoulder strength and shoulder flexibility.  Pt was able to complete sidelying active Rt shoulder abduction and external rotation without increase in pain. She demonstrated good understanding of HEP updates made during today's visit, however she may require further review to ensure she had retention of these new exercises. No pain increase in pain was reported end of session.    Personal Factors and Comorbidities  Age;Comorbidity 1    Comorbidities  Lt rotator cuff surgery    Examination-Activity Limitations  Bathing;Hygiene/Grooming;Transfers    Examination-Participation Restrictions  Cleaning;Meal Prep     Stability/Clinical Decision Making  Evolving/Moderate complexity    Rehab Potential  Excellent    PT Frequency  2x / week    PT Duration  8 weeks    PT Treatment/Interventions  ADLs/Self Care Home Management;Cryotherapy;Electrical Stimulation;Ultrasound;Moist Heat;Functional mobility training;Therapeutic activities;Therapeutic exercise;Patient/family education;Neuromuscular re-education;Manual techniques;Taping;Passive range of motion    PT Next Visit Plan  Rt shoulder flexibility, overhead use, manual for trigger points, postural strength, modalities as needed    PT Home Exercise Plan  Access Code: [email or web address]     Consulted and Agree with Plan of Care  Patient       Patient will benefit from skilled therapeutic intervention in order to improve the following deficits and impairments:  Abnormal gait, Decreased balance, Decreased activity tolerance, Decreased endurance, Decreased range of motion, Pain, Impaired flexibility, Impaired UE functional use, Decreased strength, Increased muscle spasms, Postural dysfunction  Visit Diagnosis: Chronic right shoulder pain  Muscle weakness (generalized)  Stiffness of right shoulder, not elsewhere classified  Abnormal posture     Problem List Patient Active Problem List   Diagnosis Date Noted  . GERD (gastroesophageal reflux disease)   . Hx of adenomatous polyp of colon 03/11/2007    3:56 PM,04/10/19 Donita Brooks PT, DPT [facility]  [phone]  [facility] [address] [place], [place], [address] Phone: [phone]   Fax:  [phone]  Name: Jade Barber MRN: [id number] Date of Birth: Jun 21, 1941

## 2019-04-12 ENCOUNTER — Ambulatory Visit: Payer: Medicare Other

## 2019-04-12 ENCOUNTER — Other Ambulatory Visit: Payer: Self-pay

## 2019-04-12 DIAGNOSIS — M6281 Muscle weakness (generalized): Secondary | ICD-10-CM

## 2019-04-12 DIAGNOSIS — R293 Abnormal posture: Secondary | ICD-10-CM

## 2019-04-12 DIAGNOSIS — M25611 Stiffness of right shoulder, not elsewhere classified: Secondary | ICD-10-CM | POA: Diagnosis not present

## 2019-04-12 DIAGNOSIS — M25511 Pain in right shoulder: Principal | ICD-10-CM

## 2019-04-12 DIAGNOSIS — G8929 Other chronic pain: Secondary | ICD-10-CM

## 2019-04-12 NOTE — Therapy (Signed)
[facility] [address], [address] [place], [place], [address] Phone: [phone]   Fax:  [phone]  Physical Therapy Treatment  Patient Details  Name: Jade FischerMary B Barber MRN: [id number] Date of Birth: 02/06/1941 Referring Provider (PT): Abbe AmsterdamBanks, Shannon, MD   Encounter Date: 04/12/2019  PT End of Session - 04/12/19 1243    Visit Number  3    Date for PT Re-Evaluation  06/02/19    Authorization Type  Medicare    PT Start Time  1203    PT Stop Time  1245    PT Time Calculation (min)  42 min    Activity Tolerance  Patient tolerated treatment well;No increased pain    Behavior During Therapy  WFL for tasks assessed/performed       Past Medical History:  Diagnosis Date  . Coronary artery calcification seen on CAT scan   . Diabetes mellitus   . GERD (gastroesophageal reflux disease)   . History of blood transfusion    s/p transfusion  . Hx of adenomatous polyp of colon 03/11/2007  . Hyperlipidemia   . Hypertension     Past Surgical History:  Procedure Laterality Date  . CATARACT EXTRACTION    . CESAREAN SECTION    . COLONOSCOPY    . ROTATOR CUFF REPAIR    . TONSILLECTOMY    . TUBAL LIGATION      There were no vitals filed for this visit.  Subjective Assessment - 04/12/19 1202    Subjective  My shoulder is aching today.  I am using my band at home.      Currently in Pain?  Yes    Pain Score  2     Pain Location  Shoulder    Pain Orientation  Right    Pain Descriptors / Indicators  Aching    Pain Type  Chronic pain    Pain Onset  More than a month ago    Pain Frequency  Intermittent    Aggravating Factors   using arm to turn on shower, reaching overhead, lifting, sleep on Rt side, pushing up with Rt UE from bed    Pain Relieving Factors  stopping the aggravating activity, Tylenol/ibuprofen         [facility] PT Assessment - 04/12/19 0001      AROM   Right Shoulder Flexion  110 Degrees    Right Shoulder ABduction   115 Degrees                   [facility] Adult PT Treatment/Exercise - 04/12/19 0001      Shoulder Exercises: Supine   Protraction  Both;AROM    Protraction Limitations  x10 reps    Horizontal ABduction  Strengthening;Both;20 reps;Theraband    Theraband Level (Shoulder Horizontal ABduction)  Level 2 (Red)    Flexion  AAROM;Both;15 reps   clasped hands     Shoulder Exercises: Sidelying   External Rotation  Strengthening;10 reps    External Rotation Limitations  tactile cues for proper technique      Shoulder Exercises: Standing   Flexion  AAROM;Right;15 reps   finger ladder x10, Ranger x 10   Flexion Limitations  UE ranger with therapist providing scapula assistance    Row  Strengthening;Both;20 reps;Theraband    Theraband Level (Shoulder Row)  Level 2 (Red)   scapular elevation with green     Shoulder Exercises: Pulleys   Flexion  3 minutes    ABduction  2 minutes  Manual Therapy   Manual Therapy  Soft tissue mobilization;Joint mobilization;Passive ROM    Manual therapy comments  --    Joint Mobilization  Grade III inferior/posterior GH mobilizations at 90 deg     Soft tissue mobilization  Rt anterior/middle deltoid    Passive ROM  PROM of Rt shoulder into flexion and abduction with inferior glides and distraction               PT Short Term Goals - 04/12/19 1204      PT SHORT TERM GOAL #1   Title  be independent in initial HEP    Baseline  requires tactile cues for technique    Time  4    Period  Weeks    Status  On-going      PT SHORT TERM GOAL #2   Title  demonstrate Rt shoulder A/ROM to > or = to 115 degrees flexion to improve reaching overhead    Baseline  110 degrees    Time  4    Period  Weeks    Status  On-going      PT SHORT TERM GOAL #3   Title  report a 30% reduction in Rt UE pain with reaching overhead and moving in bed    Baseline  no change    Time  4    Period  Weeks    Status  On-going        PT Long Term Goals -  04/07/19 1022      PT LONG TERM GOAL #1   Title  be independent in advanced HEP    Time  8    Period  Weeks    Status  New    Target Date  06/02/19      PT LONG TERM GOAL #2   Title  reduce FOTO to < or = to 32% limitation    Time  8    Period  Weeks    Status  New    Target Date  06/02/19      PT LONG TERM GOAL #3   Title  demonstrate 4+/5 Rt shoulder strength to allow for getting dishes in/out of cabinets and endurance for fixing hair    Time  8    Period  Weeks    Status  New    Target Date  06/02/19      PT LONG TERM GOAL #4   Title  demonstrate Rt shoulder A/ROM flexion to > or = to 120 degrees to allow for overhead reaching    Time  8    Period  Weeks    Status  New    Target Date  06/02/19      PT LONG TERM GOAL #5   Title  report < or = to 1/10 Rt shoulder pain with reaching, lifting and turing with Rt UE    Time  8    Period  Weeks    Status  New    Target Date  06/02/19            Plan - 04/12/19 1213    Clinical Impression Statement  Pt has been compliant with HEP to address Rt shoulder pain and limited A/ROM and use.  Pt requires tactile and verbal cues throughout session for scapular position and technique with exercise especially open chain motions. Pt with excessive scapular elevation with rowing with green band and PT reduced to red band today due to improved technique.  Pt denies any  change in Rt shoulder pain with activity at home and A/ROM flexion is 110 (unchanged from evaluation), abduction is improved to 115 degrees.  Pt does demonstrate improved ease with this motion and reduced scapular elevation with initiation of motion.  Pt with reduced postural strength and exercises performed today to address this.  Pt will continue to benefit from skilled PT for Rt shoulder flexibility, postural strength, manual and modalities to address pain as needed.      PT Frequency  2x / week    PT Duration  8 weeks    PT Treatment/Interventions  ADLs/Self Care Home  Management;Cryotherapy;Electrical Stimulation;Ultrasound;Moist Heat;Functional mobility training;Therapeutic activities;Therapeutic exercise;Patient/family education;Neuromuscular re-education;Manual techniques;Taping;Passive range of motion    PT Next Visit Plan  Rt shoulder flexibility, overhead use, manual for trigger points, postural strength, modalities as needed    PT Home Exercise Plan  Access Code: [email or web address]     Recommended Other Services  initial certification is signed    Consulted and Agree with Plan of Care  Patient       Patient will benefit from skilled therapeutic intervention in order to improve the following deficits and impairments:  Abnormal gait, Decreased balance, Decreased activity tolerance, Decreased endurance, Decreased range of motion, Pain, Impaired flexibility, Impaired UE functional use, Decreased strength, Increased muscle spasms, Postural dysfunction  Visit Diagnosis: Chronic right shoulder pain  Muscle weakness (generalized)  Stiffness of right shoulder, not elsewhere classified  Abnormal posture     Problem List Patient Active Problem List   Diagnosis Date Noted  . GERD (gastroesophageal reflux disease)   . Hx of adenomatous polyp of colon 03/11/2007     Lorrene Reid, PT 04/12/19 12:46 PM  [facility] [address] [place], [place], [address] Phone: [phone]   Fax:  [phone]  Name: Jade Barber MRN: [id number] Date of Birth: Jul 12, 1941

## 2019-04-14 DIAGNOSIS — E1151 Type 2 diabetes mellitus with diabetic peripheral angiopathy without gangrene: Secondary | ICD-10-CM | POA: Diagnosis not present

## 2019-04-14 DIAGNOSIS — B351 Tinea unguium: Secondary | ICD-10-CM | POA: Diagnosis not present

## 2019-04-14 DIAGNOSIS — L84 Corns and callosities: Secondary | ICD-10-CM | POA: Diagnosis not present

## 2019-04-21 ENCOUNTER — Other Ambulatory Visit: Payer: Self-pay

## 2019-04-21 ENCOUNTER — Ambulatory Visit: Payer: Medicare Other | Attending: Family Medicine

## 2019-04-21 DIAGNOSIS — G8929 Other chronic pain: Secondary | ICD-10-CM | POA: Diagnosis not present

## 2019-04-21 DIAGNOSIS — R293 Abnormal posture: Secondary | ICD-10-CM | POA: Insufficient documentation

## 2019-04-21 DIAGNOSIS — M6281 Muscle weakness (generalized): Secondary | ICD-10-CM | POA: Insufficient documentation

## 2019-04-21 DIAGNOSIS — M25511 Pain in right shoulder: Secondary | ICD-10-CM | POA: Diagnosis not present

## 2019-04-21 DIAGNOSIS — M25611 Stiffness of right shoulder, not elsewhere classified: Secondary | ICD-10-CM | POA: Diagnosis not present

## 2019-04-21 NOTE — Therapy (Signed)
[facility] [address], [address] [place], [place], [address] Phone: [phone]   Fax:  [phone]  Physical Therapy Treatment  Patient Details  Name: Jade FischerMary B Barber MRN: [id number] Date of Birth: 11/07/1941 Referring Provider (PT): Abbe AmsterdamBanks, Shannon, MD   Encounter Date: 04/21/2019  PT End of Session - 04/21/19 1240    Visit Number  4    Date for PT Re-Evaluation  06/02/19    Authorization Type  Medicare    PT Start Time  1157    PT Stop Time  1241    PT Time Calculation (min)  44 min    Activity Tolerance  Patient tolerated treatment well;No increased pain    Behavior During Therapy  WFL for tasks assessed/performed       Past Medical History:  Diagnosis Date  . Coronary artery calcification seen on CAT scan   . Diabetes mellitus   . GERD (gastroesophageal reflux disease)   . History of blood transfusion    s/p transfusion  . Hx of adenomatous polyp of colon 03/11/2007  . Hyperlipidemia   . Hypertension     Past Surgical History:  Procedure Laterality Date  . CATARACT EXTRACTION    . CESAREAN SECTION    . COLONOSCOPY    . ROTATOR CUFF REPAIR    . TONSILLECTOMY    . TUBAL LIGATION      There were no vitals filed for this visit.  Subjective Assessment - 04/21/19 1158    Subjective  My shoulder is hurting a little bit today.  No change in my symptoms since the start of care.      Patient Stated Goals  reduce Rt shoulder pain, use Rt UE for self-care and ADLs without limitation    Currently in Pain?  Yes    Pain Score  2     Pain Location  Shoulder    Pain Orientation  Right    Pain Descriptors / Indicators  Aching    Pain Type  Chronic pain    Pain Onset  More than a month ago    Pain Frequency  Intermittent    Aggravating Factors   rolling in bed, reaching overhead, turning on faucet.    Pain Relieving Factors  stopping the aggravating activity, Tylenol/ibuprofen         [facility] PT Assessment - 04/21/19  0001      AROM   Right Shoulder Flexion  115 Degrees    Right Shoulder Internal Rotation  --   L1 medial                  [facility] Adult PT Treatment/Exercise - 04/21/19 0001      Shoulder Exercises: Supine   Horizontal ABduction  Strengthening;Both;20 reps;Theraband    Theraband Level (Shoulder Horizontal ABduction)  Level 2 (Red)    Flexion  AAROM;Both;15 reps   clasped hands   Flexion Limitations  using SPC      Shoulder Exercises: Sidelying   External Rotation  Strengthening;20 reps;Weights    External Rotation Weight (lbs)  2    External Rotation Limitations  tactile cues for proper technique      Shoulder Exercises: Standing   Flexion  AAROM;Right;15 reps   finger ladder x10, Ranger x 10   Flexion Limitations  UE ranger with therapist providing scapula assistance    Row  Strengthening;Both;20 reps;Theraband    Theraband Level (Shoulder Row)  Level 2 (Red)   scapular elevation with green  Manual Therapy   Manual Therapy  Soft tissue mobilization;Joint mobilization;Passive ROM    Joint Mobilization  Grade III inferior/posterior GH mobilizations at 90 deg     Soft tissue mobilization  Rt anterior/middle deltoid    Passive ROM  PROM of Rt shoulder into flexion and abduction with inferior glides and distraction               PT Short Term Goals - 04/21/19 1200      PT SHORT TERM GOAL #1   Title  be independent in initial HEP    Status  Achieved      PT SHORT TERM GOAL #2   Title  demonstrate Rt shoulder A/ROM to > or = to 115 degrees flexion to improve reaching overhead    Baseline  115    Status  Achieved      PT SHORT TERM GOAL #3   Title  report a 30% reduction in Rt UE pain with reaching overhead and moving in bed    Baseline  no change    Time  4    Period  Weeks    Status  On-going      PT SHORT TERM GOAL #4   Title  demonstrate Rt shoulder A/ROM IR to L5 to improve self care    Baseline  L1    Status  Achieved        PT Long  Term Goals - 04/07/19 1022      PT LONG TERM GOAL #1   Title  be independent in advanced HEP    Time  8    Period  Weeks    Status  New    Target Date  06/02/19      PT LONG TERM GOAL #2   Title  reduce FOTO to < or = to 32% limitation    Time  8    Period  Weeks    Status  New    Target Date  06/02/19      PT LONG TERM GOAL #3   Title  demonstrate 4+/5 Rt shoulder strength to allow for getting dishes in/out of cabinets and endurance for fixing hair    Time  8    Period  Weeks    Status  New    Target Date  06/02/19      PT LONG TERM GOAL #4   Title  demonstrate Rt shoulder A/ROM flexion to > or = to 120 degrees to allow for overhead reaching    Time  8    Period  Weeks    Status  New    Target Date  06/02/19      PT LONG TERM GOAL #5   Title  report < or = to 1/10 Rt shoulder pain with reaching, lifting and turing with Rt UE    Time  8    Period  Weeks    Status  New    Target Date  06/02/19            Plan - 04/21/19 1206    Clinical Impression Statement  Pt is compliant in HEP and denies any change in pain with use.  Pt with improved Rt shoulder A/ROM into flexion and IR today.  Pt requires tactile cues for scapular retraction and depression with exercise today.  Pt is consistent and compliant with HEP and continues to require correct technique with red band.  Green band is challenging and pt performs substitutions.  Pt will  continue to benefit from skilled PT for Rt shoulder ROM, strength and endurance with emphasis on functional movement.      Rehab Potential  Excellent    PT Frequency  2x / week    PT Duration  8 weeks    PT Treatment/Interventions  ADLs/Self Care Home Management;Cryotherapy;Electrical Stimulation;Ultrasound;Moist Heat;Functional mobility training;Therapeutic activities;Therapeutic exercise;Patient/family education;Neuromuscular re-education;Manual techniques;Taping;Passive range of motion    PT Next Visit Plan  Rt shoulder flexibility,  scapular strength, overhead use, manual for trigger points, postural strength, modalities as needed.  Pt will come 1x/wk for 2 weeks until her trip to [place].      PT Home Exercise Plan  Access Code: [email or web address]     Consulted and Agree with Plan of Care  Patient       Patient will benefit from skilled therapeutic intervention in order to improve the following deficits and impairments:  Abnormal gait, Decreased balance, Decreased activity tolerance, Decreased endurance, Decreased range of motion, Pain, Impaired flexibility, Impaired UE functional use, Decreased strength, Increased muscle spasms, Postural dysfunction  Visit Diagnosis: Chronic right shoulder pain  Muscle weakness (generalized)  Stiffness of right shoulder, not elsewhere classified  Abnormal posture     Problem List Patient Active Problem List   Diagnosis Date Noted  . GERD (gastroesophageal reflux disease)   . Hx of adenomatous polyp of colon 03/11/2007    Lorrene Reid, PT 04/21/19 12:46 PM  [facility] [address], [address] [place], [place], [address] Phone: [phone]   Fax:  [phone]  Name: Jade Barber MRN: [id number] Date of Birth: 24-Nov-1941

## 2019-04-27 ENCOUNTER — Other Ambulatory Visit: Payer: Self-pay

## 2019-04-27 ENCOUNTER — Encounter: Payer: Self-pay | Admitting: Physical Therapy

## 2019-04-27 ENCOUNTER — Ambulatory Visit: Payer: Medicare Other | Admitting: Physical Therapy

## 2019-04-27 DIAGNOSIS — R293 Abnormal posture: Secondary | ICD-10-CM | POA: Diagnosis not present

## 2019-04-27 DIAGNOSIS — G8929 Other chronic pain: Secondary | ICD-10-CM

## 2019-04-27 DIAGNOSIS — M6281 Muscle weakness (generalized): Secondary | ICD-10-CM | POA: Diagnosis not present

## 2019-04-27 DIAGNOSIS — M25611 Stiffness of right shoulder, not elsewhere classified: Secondary | ICD-10-CM | POA: Diagnosis not present

## 2019-04-27 DIAGNOSIS — M25511 Pain in right shoulder: Secondary | ICD-10-CM | POA: Diagnosis not present

## 2019-04-27 NOTE — Therapy (Signed)
[facility] [address], [address] [place], [place], [address] Phone: [phone]   Fax:  [phone]  Physical Therapy Treatment  Patient Details  Name: Jade FischerMary B Barber MRN: [id number] Date of Birth: 12/27/1940 Referring Provider (PT): Abbe AmsterdamBanks, Shannon, MD   Encounter Date: 04/27/2019  PT End of Session - 04/27/19 1647    Visit Number  5    Date for PT Re-Evaluation  06/02/19    Authorization Type  Medicare    PT Start Time  1600    PT Stop Time  1643    PT Time Calculation (min)  43 min    Activity Tolerance  Patient tolerated treatment well;No increased pain    Behavior During Therapy  WFL for tasks assessed/performed       Past Medical History:  Diagnosis Date  . Coronary artery calcification seen on CAT scan   . Diabetes mellitus   . GERD (gastroesophageal reflux disease)   . History of blood transfusion    s/p transfusion  . Hx of adenomatous polyp of colon 03/11/2007  . Hyperlipidemia   . Hypertension     Past Surgical History:  Procedure Laterality Date  . CATARACT EXTRACTION    . CESAREAN SECTION    . COLONOSCOPY    . ROTATOR CUFF REPAIR    . TONSILLECTOMY    . TUBAL LIGATION      There were no vitals filed for this visit.  Subjective Assessment - 04/27/19 1602    Subjective  Pt states that things are going well. She is doing her HEP. Still pain when getting in and out of bed. No pain currently.     Patient Stated Goals  reduce Rt shoulder pain, use Rt UE for self-care and ADLs without limitation    Currently in Pain?  No/denies    Pain Onset  More than a month ago                       [facility] Treatment/Exercise - 04/27/19 0001      Shoulder Exercises: Supine   Flexion  AAROM;Strengthening;Both;12 reps    Flexion Limitations  SPC with 2# weight      Shoulder Exercises: Seated   Elevation  AAROM;Both;12 reps    External Rotation  Strengthening;Both;10 reps    Theraband  Level (Shoulder External Rotation)  Level 2 (Red)    Diagonals  Strengthening;Right;AAROM;10 reps    Theraband Level (Shoulder Diagonals)  Level 1 (Yellow)    Diagonals Limitations  D1 flexion with yellow TB, D2 flexion with active assist    Other Seated Exercises  Rt shoulder abduction AAROM x10 reps      Shoulder Exercises: Sidelying   External Rotation  Strengthening;Right;10 reps    External Rotation Weight (lbs)  3    External Rotation Limitations  x2 sets       Shoulder Exercises: Standing   Flexion  AAROM;Right;10 reps    Flexion Limitations  UE ranger pain noted at 120 deg     Other Standing Exercises  closed chain rocking in modified quadruped Lt/Rt and forward/back x10 reps              PT Education - 04/27/19 1647    Education Details  technique with therex    Person(s) Educated  Patient    Methods  Explanation;Verbal cues;Tactile cues    Comprehension  Verbalized understanding;Returned demonstration       PT Short Term Goals -  04/21/19 1200      PT SHORT TERM GOAL #1   Title  be independent in initial HEP    Status  Achieved      PT SHORT TERM GOAL #2   Title  demonstrate Rt shoulder A/ROM to > or = to 115 degrees flexion to improve reaching overhead    Baseline  115    Status  Achieved      PT SHORT TERM GOAL #3   Title  report a 30% reduction in Rt UE pain with reaching overhead and moving in bed    Baseline  no change    Time  4    Period  Weeks    Status  On-going      PT SHORT TERM GOAL #4   Title  demonstrate Rt shoulder A/ROM IR to L5 to improve self care    Baseline  L1    Status  Achieved        PT Long Term Goals - 04/07/19 1022      PT LONG TERM GOAL #1   Title  be independent in advanced HEP    Time  8    Period  Weeks    Status  New    Target Date  06/02/19      PT LONG TERM GOAL #2   Title  reduce FOTO to < or = to 32% limitation    Time  8    Period  Weeks    Status  New    Target Date  06/02/19      PT LONG TERM  GOAL #3   Title  demonstrate 4+/5 Rt shoulder strength to allow for getting dishes in/out of cabinets and endurance for fixing hair    Time  8    Period  Weeks    Status  New    Target Date  06/02/19      PT LONG TERM GOAL #4   Title  demonstrate Rt shoulder A/ROM flexion to > or = to 120 degrees to allow for overhead reaching    Time  8    Period  Weeks    Status  New    Target Date  06/02/19      PT LONG TERM GOAL #5   Title  report < or = to 1/10 Rt shoulder pain with reaching, lifting and turing with Rt UE    Time  8    Period  Weeks    Status  New    Target Date  06/02/19            Plan - 04/27/19 1647    Clinical Impression Statement  Pt reports improvements in her pain and functional use of the RUE. She was able to complete active assisted shoulder flexion to 120 deg without pain and notes that she is able to prop on her elbow when getting in and out of bed without pain. Continued with focus on progressive shoulder strengthening, including more upright positions. Attempted RUE diagonals, but pt had difficulty with the coordination of this. [place] is traveling out the [place] after her next appointment, so we will plan to update her HEP at that time.     Rehab Potential  Excellent    PT Frequency  2x / week    PT Duration  8 weeks    PT Treatment/Interventions  ADLs/Self Care Home Management;Cryotherapy;Electrical Stimulation;Ultrasound;Moist Heat;Functional mobility training;Therapeutic activities;Therapeutic exercise;Patient/family education;Neuromuscular re-education;Manual techniques;Taping;Passive range of motion    PT Next  Visit Plan  update HEP; measurements     PT Home Exercise Plan  Access Code: [email or web address]     Consulted and Agree with Plan of Care  Patient       Patient will benefit from skilled therapeutic intervention in order to improve the following deficits and impairments:  Abnormal gait, Decreased balance, Decreased activity tolerance, Decreased endurance,  Decreased range of motion, Pain, Impaired flexibility, Impaired UE functional use, Decreased strength, Increased muscle spasms, Postural dysfunction  Visit Diagnosis: Chronic right shoulder pain  Muscle weakness (generalized)  Stiffness of right shoulder, not elsewhere classified  Abnormal posture     Problem List Patient Active Problem List   Diagnosis Date Noted  . GERD (gastroesophageal reflux disease)   . Hx of adenomatous polyp of colon 03/11/2007    4:51 PM,04/27/19 Donita Brooks PT, DPT [facility]  [phone]  [facility] [address] [place], [place], [address] Phone: [phone]   Fax:  [phone]  Name: Jade Barber MRN: [id number] Date of Birth: [birth year]

## 2019-05-02 ENCOUNTER — Other Ambulatory Visit: Payer: Self-pay

## 2019-05-02 ENCOUNTER — Ambulatory Visit: Payer: Medicare Other | Admitting: Physical Therapy

## 2019-05-02 ENCOUNTER — Encounter: Payer: Self-pay | Admitting: Physical Therapy

## 2019-05-02 DIAGNOSIS — R293 Abnormal posture: Secondary | ICD-10-CM | POA: Diagnosis not present

## 2019-05-02 DIAGNOSIS — M25511 Pain in right shoulder: Secondary | ICD-10-CM | POA: Diagnosis not present

## 2019-05-02 DIAGNOSIS — M25611 Stiffness of right shoulder, not elsewhere classified: Secondary | ICD-10-CM | POA: Diagnosis not present

## 2019-05-02 DIAGNOSIS — G8929 Other chronic pain: Secondary | ICD-10-CM | POA: Diagnosis not present

## 2019-05-02 DIAGNOSIS — M6281 Muscle weakness (generalized): Secondary | ICD-10-CM

## 2019-05-02 NOTE — Patient Instructions (Signed)
Access Code: [email or web address]  URL: [email or web address]  Date: 05/02/2019  Prepared by: Donita Brooks   Exercises  Standing Row with Anchored Resistance - 15 reps - 2 sets - 1x daily - 7x weekly  Seated Shoulder External Rotation with Resistance - 10 reps - 1-2 sets - 1-2x daily - 7x weekly  Shoulder Internal Rotation with Resistance - 10 reps - 1 sets - 1-2x daily - 7x weekly  Standing Single Arm Elbow Flexion with Resistance - 10 reps - 2 sets - 1x daily - 7x weekly  Standing shoulder flexion wall slides - 10 reps - 1 sets - 1-2x daily - 7x weekly    [facility] [address] [place], [place] [address] Phone # [phone] Fax [phone]

## 2019-05-02 NOTE — Therapy (Addendum)
[facility] [address], [address] [place], [place], [address] Phone: [phone]   Fax:  [phone]  Physical Therapy Treatment/re-eval/Discharge  Patient Details  Name: Jade Barber MRN: [id number] Date of Birth: 10/19/1941 Referring Provider (PT): Banks, Shannon, MD   Encounter Date: 05/02/2019  PT End of Session - 05/02/19 0922    Visit Number  6    Date for PT Re-Evaluation  06/02/19    Authorization Type  Medicare    PT Start Time  0915    PT Stop Time  0955    PT Time Calculation (min)  40 min    Activity Tolerance  Patient tolerated treatment well;No increased pain    Behavior During Therapy  WFL for tasks assessed/performed       Past Medical History:  Diagnosis Date  . Coronary artery calcification seen on CAT scan   . Diabetes mellitus   . GERD (gastroesophageal reflux disease)   . History of blood transfusion    s/p transfusion  . Hx of adenomatous polyp of colon 03/11/2007  . Hyperlipidemia   . Hypertension     Past Surgical History:  Procedure Laterality Date  . CATARACT EXTRACTION    . CESAREAN SECTION    . COLONOSCOPY    . ROTATOR CUFF REPAIR    . TONSILLECTOMY    . TUBAL LIGATION      There were no vitals filed for this visit.  Subjective Assessment - 05/02/19 0916    Subjective  Pt states that things are going well. She feels that her Rt arm is much better than when she started, because she can reach higher than before without pain.     Patient Stated Goals  reduce Rt shoulder pain, use Rt UE for self-care and ADLs without limitation    Currently in Pain?  No/denies    Pain Onset  More than a month ago         [facility] PT Assessment - 05/02/19 0001      Assessment   Medical Diagnosis  arthritis of Rt acromioclavicular joint, tendinopathy of Rt rotator cuff    Referring Provider (PT)  Banks, Shannon, MD    Onset Date/Surgical Date  02/08/19    Hand Dominance  Right    Next MD Visit   none    Prior Therapy  none      Precautions   Precautions  None      Balance Screen   Has the patient fallen in the past 6 months  No    Has the patient had a decrease in activity level because of a fear of falling?   No    Is the patient reluctant to leave their home because of a fear of falling?   No      Home Environment   Living Environment  Private residence    Living Arrangements  Alone    Type of Home  Apartment    Home Access  Stairs to enter    Home Layout  One level    Home Equipment  None      Prior Function   Level of Independence  Independent    Vocation  Retired    Leisure  walking, reading      Cognition   Overall Cognitive Status  Within Functional Limits for tasks assessed      Observation/Other Assessments   Focus on Therapeutic Outcomes (FOTO)   30% limitation      Posture/Postural Control     Posture/Postural Control  Postural limitations    Postural Limitations  Rounded Shoulders;Forward head      AROM   Overall AROM   Deficits    Right Shoulder Flexion  120 Degrees   pain at end range    Right Shoulder ABduction  117 Degrees   pain   Right Shoulder Internal Rotation  --   L1   Right Shoulder External Rotation  --   T1   Left Shoulder Flexion  120 Degrees    Left Shoulder ABduction  150 Degrees    Left Shoulder Internal Rotation  --   to T8   Left Shoulder External Rotation  --   to T2     PROM   Overall PROM   Deficits    Right Shoulder Flexion  140 Degrees      Strength   Overall Strength  Deficits    Overall Strength Comments  Lt shoulder 4+/5    Right Shoulder Flexion  4-/5   (+) pain    Right Shoulder ABduction  4-/5   (+) pain   Right Shoulder Internal Rotation  4+/5   pain free    Right Shoulder External Rotation  4+/5   pain free     Palpation   Palpation comment  Tenderness with supraspinatus palpation      Ambulation/Gait   Ambulation/Gait  Yes    Gait Pattern  Step-through pattern;Decreased arm swing -  right;Decreased arm swing - left                   [facility] Adult PT Treatment/Exercise - 05/02/19 0001      Shoulder Exercises: Standing   External Rotation  Strengthening;Right;10 reps    Theraband Level (Shoulder External Rotation)  Level 1 (Yellow)    Internal Rotation  Strengthening;Right;10 reps    Theraband Level (Shoulder Internal Rotation)  Level 1 (Yellow)    Flexion  AAROM;Right;Left;5 reps    Flexion Limitations  end range stretch    Other Standing Exercises  Rt bicep curl yellow TB x15 reps              PT Education - 05/02/19 0955    Education Details  updated and reviewed HEP; discussed pt on hold until she returns from her trip    Person(s) Educated  Patient    Methods  Explanation;Handout    Comprehension  Verbalized understanding;Verbal cues required       PT Short Term Goals - 05/02/19 0928      PT SHORT TERM GOAL #1   Title  be independent in initial HEP    Status  Achieved      PT SHORT TERM GOAL #2   Title  demonstrate Rt shoulder A/ROM to > or = to 115 degrees flexion to improve reaching overhead    Baseline  115    Status  Achieved      PT SHORT TERM GOAL #3   Title  report a 30% reduction in Rt UE pain with reaching overhead and moving in bed    Baseline  80%    Time  4    Period  Weeks    Status  On-going      PT SHORT TERM GOAL #4   Title  demonstrate Rt shoulder A/ROM IR to L5 to improve self care    Baseline  L1    Status  Achieved        PT Long Term Goals - 05/02/19 0930        PT LONG TERM GOAL #1   Title  be independent in advanced HEP    Time  8    Period  Weeks    Status  Achieved      PT LONG TERM GOAL #2   Title  reduce FOTO to < or = to 32% limitation    Baseline  30%    Time  8    Period  Weeks    Status  Achieved      PT LONG TERM GOAL #3   Title  demonstrate 4+/5 Rt shoulder strength to allow for getting dishes in/out of cabinets and endurance for fixing hair    Baseline  all except abduction     Time  8    Period  Weeks    Status  Partially Met      PT LONG TERM GOAL #4   Title  demonstrate Rt shoulder A/ROM flexion to > or = to 120 degrees to allow for overhead reaching    Baseline  120 deg with pain     Time  8    Period  Weeks    Status  Achieved      PT LONG TERM GOAL #5   Title  report < or = to 1/10 Rt shoulder pain with reaching, lifting and turing with Rt UE    Baseline  7 or 8    Time  8    Period  Weeks    Status  Not Met            Plan - 05/02/19 0957    Clinical Impression Statement  Pt was re-evaluated this visit having met all of her short term goals since beginning PT. Her active shoulder flexion has improved to 120 deg and pain with this at higher ranges than previously found. Her FOTO score improved to 30% limitation as well. There are also noted improvements in shoulder strength up to atleast 4+/5 except for shoulder abduction. Pt is moving out of the state and will be gone for the next 2 weeks, unsure if she will be able to continue PT. Her HEP was updated today to ensure full understanding and therapist discussed ways to progress her HEP on her own as well. Pt will be placed on hold at this time and will follow up with [place] once she returns from out of state.     Rehab Potential  Excellent    PT Frequency  2x / week    PT Duration  8 weeks    PT Treatment/Interventions  ADLs/Self Care Home Management;Cryotherapy;Electrical Stimulation;Ultrasound;Moist Heat;Functional mobility training;Therapeutic activities;Therapeutic exercise;Patient/family education;Neuromuscular re-education;Manual techniques;Taping;Passive range of motion    PT Next Visit Plan  update HEP if needed    PT Home Exercise Plan  Access Code: [email or web address]     Consulted and Agree with Plan of Care  Patient       Patient will benefit from skilled therapeutic intervention in order to improve the following deficits and impairments:  Abnormal gait, Decreased balance, Decreased activity  tolerance, Decreased endurance, Decreased range of motion, Pain, Impaired flexibility, Impaired UE functional use, Decreased strength, Increased muscle spasms, Postural dysfunction  Visit Diagnosis: Chronic right shoulder pain  Muscle weakness (generalized)  Stiffness of right shoulder, not elsewhere classified  Abnormal posture     Problem List Patient Active Problem List   Diagnosis Date Noted  . GERD (gastroesophageal reflux disease)   . Hx of adenomatous polyp of colon 03/11/2007    10:06 AM,05/02/19 Clarise Cruz  Costella PT, DPT [facility]  [phone]  [facility] [address], [address] [place], [place], [address] Phone: [phone]   Fax:  [phone]  Name: Jade Barber MRN: [id number] Date of Birth: 02/28/1941   *addendum to resolve episode of care and d/c pt from skilled PT  PHYSICAL THERAPY DISCHARGE SUMMARY  Visits from Start of Care: 6  Current functional level related to goals / functional outcomes: See above for more details    Remaining deficits: See above for more details    Education / Equipment: See above for more details   Plan: Patient agrees to discharge.  Patient goals were partially met. Patient is being discharged due to not returning since the last visit.  ?????   Pt moved out of state  9:01 AM,06/27/19 Sara Costella PT, DPT [facility]  [phone]      

## 2020-03-11 IMAGING — DX DG SHOULDER 2+V*R*
3 series · 3 of 3 positions shown · non-contrast
Comparison: None.

CLINICAL DATA: Right shoulder pain and limited range of motion
since a fall 1 week ago.

EXAM:
RIGHT SHOULDER - 2+ VIEW

[shoulder internal rotation ap]
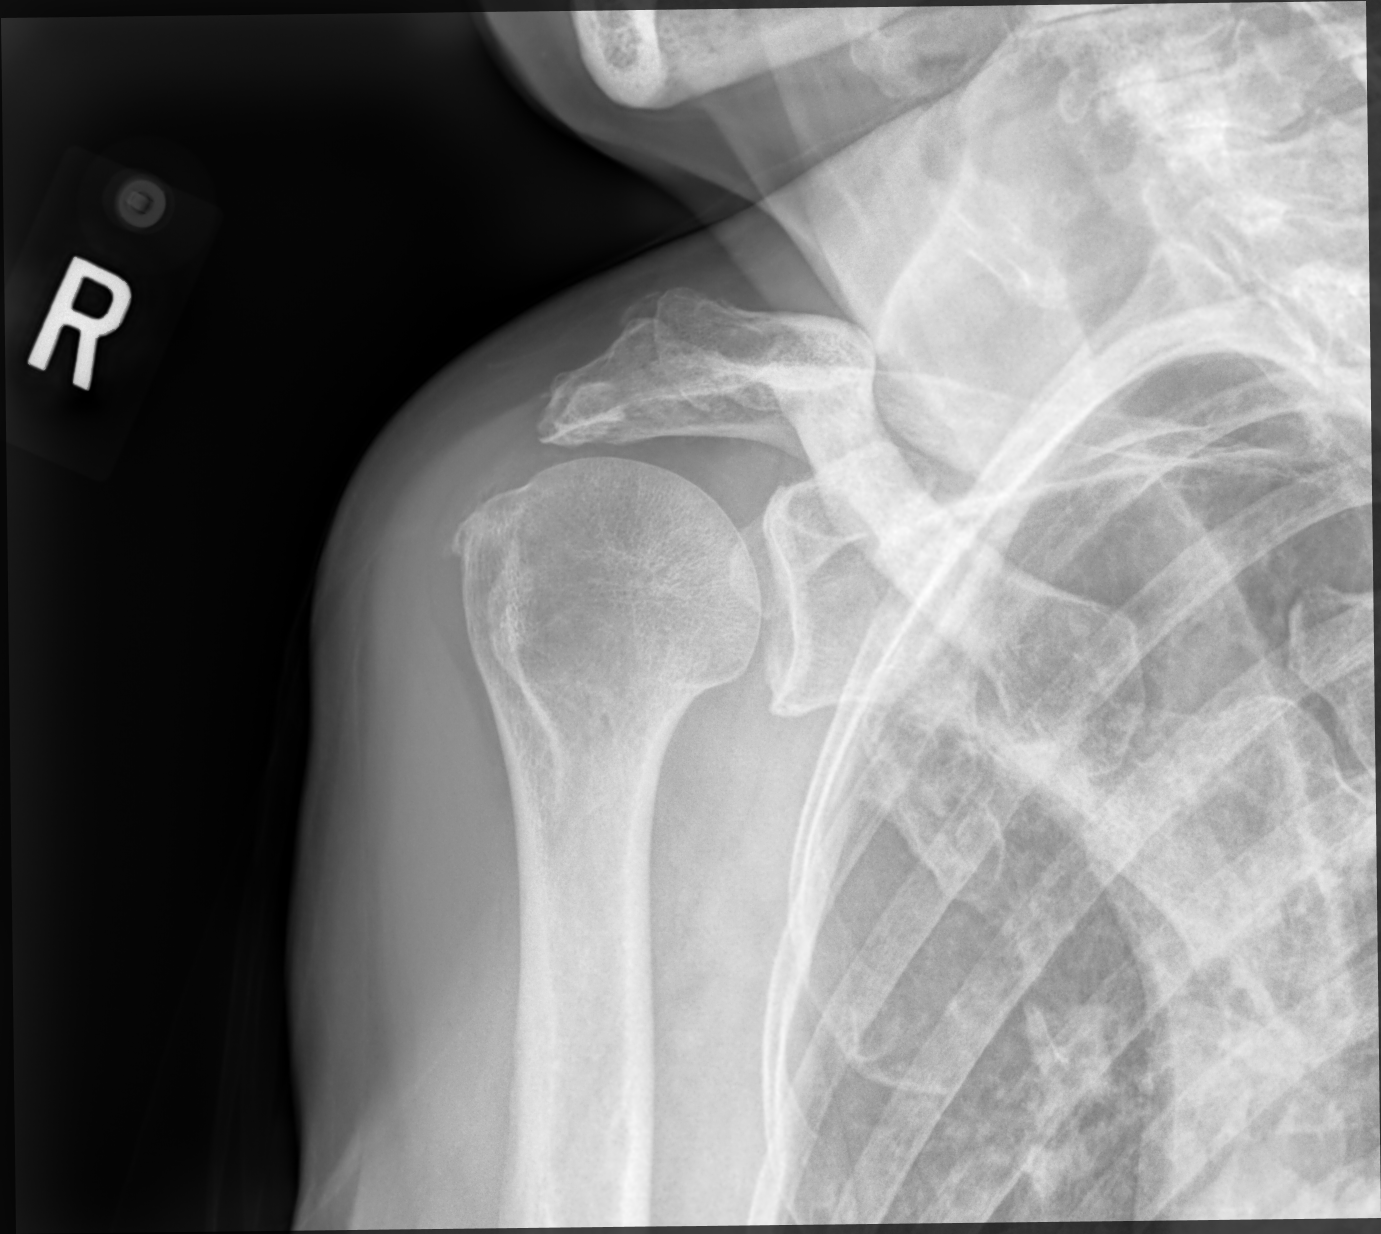

[shoulder (axial)]
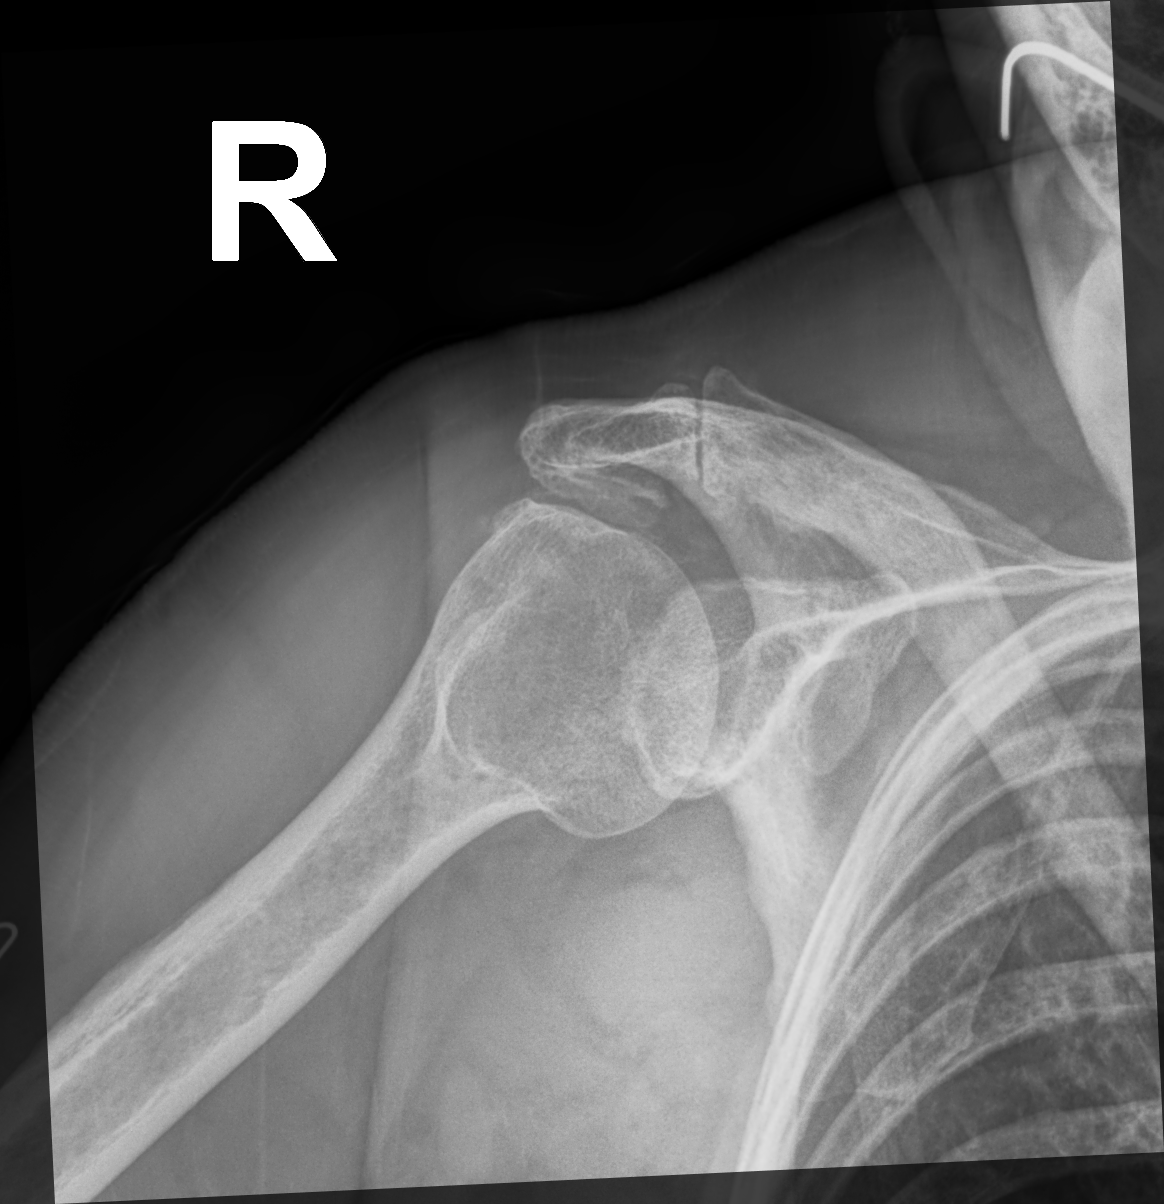

[shoulder (y view)]
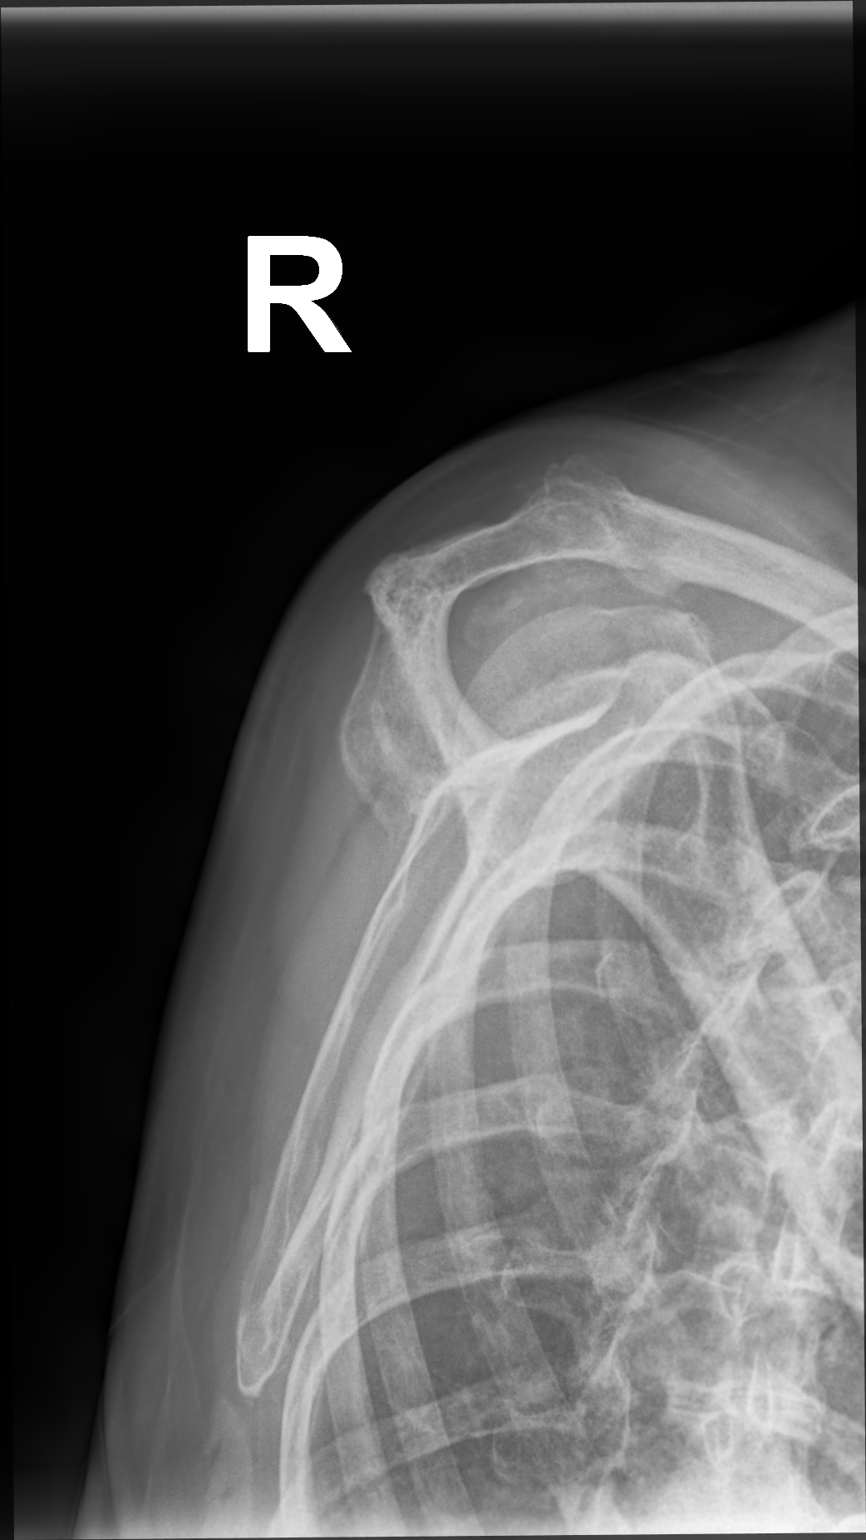

[3 of 3 positions shown; findings below may reference images not displayed]

FINDINGS: There is no fracture or dislocation. Slight AC joint arthropathy.
Calcific tendinopathy of the rotator cuff.
IMPRESSION: No acute abnormality.  Calcific tendinopathy of the rotator cuff.
